# Patient Record
Sex: Female | Born: 1960
Health system: Southern US, Community
[De-identification: ages and names within clinical notes are randomized; demographics above are authoritative.]

## PROBLEM LIST (undated history)

## (undated) DIAGNOSIS — I10 Essential (primary) hypertension: Secondary | ICD-10-CM

## (undated) DIAGNOSIS — K219 Gastro-esophageal reflux disease without esophagitis: Secondary | ICD-10-CM

## (undated) DIAGNOSIS — E785 Hyperlipidemia, unspecified: Secondary | ICD-10-CM

## (undated) HISTORY — DX: Essential (primary) hypertension: I10

## (undated) HISTORY — DX: Hyperlipidemia, unspecified: E78.5

## (undated) HISTORY — DX: Gastro-esophageal reflux disease without esophagitis: K21.9

## (undated) HISTORY — PX: COLPOSCOPY: SHX161

---

## 2018-11-30 ENCOUNTER — Ambulatory Visit: Payer: 59 | Admitting: Family Medicine

## 2018-11-30 ENCOUNTER — Encounter: Payer: Self-pay | Admitting: Family Medicine

## 2018-11-30 VITALS — BP 108/72 | HR 62 | Temp 97.5°F | Ht <= 58 in | Wt 126.8 lb

## 2018-11-30 DIAGNOSIS — I10 Essential (primary) hypertension: Secondary | ICD-10-CM

## 2018-11-30 DIAGNOSIS — M79675 Pain in left toe(s): Secondary | ICD-10-CM

## 2018-11-30 DIAGNOSIS — E7849 Other hyperlipidemia: Secondary | ICD-10-CM | POA: Diagnosis not present

## 2018-11-30 DIAGNOSIS — K219 Gastro-esophageal reflux disease without esophagitis: Secondary | ICD-10-CM

## 2018-11-30 DIAGNOSIS — E785 Hyperlipidemia, unspecified: Secondary | ICD-10-CM

## 2018-11-30 DIAGNOSIS — G629 Polyneuropathy, unspecified: Secondary | ICD-10-CM

## 2018-11-30 DIAGNOSIS — G63 Polyneuropathy in diseases classified elsewhere: Secondary | ICD-10-CM

## 2018-11-30 MED ORDER — VALSARTAN 160 MG PO TABS: 160.0000 mg | ORAL_TABLET | Freq: Every day | ORAL | 3 refills | Status: DC

## 2018-11-30 NOTE — Progress Notes (Signed)
BP 108/72   Pulse 62   Temp (!) 97.5 F (36.4 C) (Oral)   Ht '4\' 9"'  (1.448 m)   Wt 126 lb 12.8 oz (57.5 kg)   BMI 27.44 kg/m    Subjective:    Patient ID: Amy Mccarty, female    DOB: 24-Feb-1961, 57 y.o.   MRN: [id number]  HPI: Kayelyn Lemon is a 57 y.o. female presenting on 11/30/2018 for New Patient (Initial Visit) (est care- Do you have records from [place]- [facility] ); Generalized Body Aches (Patient states that she has shooting pains all over her body. Has been on and off but was worse last night.); and Nail Problem (Left big toe- Patient states that it has been like that for awhile)   HPI Hypertension She is coming in for as a new patient to establish care with our office.  Patient is currently on valsartan-hydrochlorthiazide, and their blood pressure today is 108/72. Patient denies any lightheadedness or dizziness. Patient denies headaches, blurred vision, chest pains, shortness of breath, or weakness. Denies any side effects from medication and is content with current medication.   GERD Patient is currently on pantoprazole.  She denies any major symptoms or abdominal pain or belching or burping. She denies any blood in her stool or lightheadedness or dizziness.   Hyperlipidemia Patient is coming in for recheck of his hyperlipidemia. The patient is currently taking pravastatin. They deny any issues with myalgias or history of liver damage from it. They deny any focal numbness or weakness or chest pain.   Left toe pain and burning Patient comes in with complaints of left toe pain and burning in the left toe that has been bothering her over the past few months and has been gradually increasing.  She describes it as a burning sensation that is near the end of her toe but in the middle, it does not hurt more when she touches it but is more in the middle.  Patient also complains of body aches and shooting pain and muscle aches all of her body  Relevant past medical,  surgical, family and social history reviewed and updated as indicated. Interim medical history since our last visit reviewed. Allergies and medications reviewed and updated.  Review of Systems  Constitutional: Negative for chills and fever.  Eyes: Negative for visual disturbance.  Respiratory: Negative for chest tightness and shortness of breath.   Cardiovascular: Negative for chest pain and leg swelling.  Musculoskeletal: Positive for arthralgias and myalgias. Negative for back pain and gait problem.  Skin: Negative for rash.  Neurological: Negative for light-headedness and headaches.  Psychiatric/Behavioral: Negative for agitation and behavioral problems.  All other systems reviewed and are negative.   Per HPI unless specifically indicated above  Social History   Socioeconomic History  . Marital status: Married    Spouse name: Not on file  . Number of children: Not on file  . Years of education: Not on file  . Highest education level: Not on file  Occupational History  . Not on file  Social Needs  . Financial resource strain: Not on file  . Food insecurity:    Worry: Not on file    Inability: Not on file  . Transportation needs:    Medical: Not on file    Non-medical: Not on file  Tobacco Use  . Smoking status: Never Smoker  . Smokeless tobacco: Never Used  Substance and Sexual Activity  . Alcohol use: Yes    Comment: occ  .  Drug use: Never  . Sexual activity: Not Currently    Comment: pain with intercourse with husband, will come back for pap  Lifestyle  . Physical activity:    Days per week: Not on file    Minutes per session: Not on file  . Stress: Not on file  Relationships  . Social connections:    Talks on phone: Not on file    Gets together: Not on file    Attends religious service: Not on file    Active member of club or organization: Not on file    Attends meetings of clubs or organizations: Not on file    Relationship status: Not on file  .  Intimate partner violence:    Fear of current or ex partner: Not on file    Emotionally abused: Not on file    Physically abused: Not on file    Forced sexual activity: Not on file  Other Topics Concern  . Not on file  Social History Narrative  . Not on file    Past Surgical History:  Procedure Laterality Date  . CESAREAN SECTION      Family History  Problem Relation Age of Onset  . Diabetes Mother   . Hyperlipidemia Mother   . Hypertension Mother   . Anuerysm Mother   . Stroke Father   . Diabetes Brother   . Hyperlipidemia Brother   . Hypertension Brother     Allergies as of 11/30/2018      Reactions   Ibuprofen Rash   Naproxen Rash      Medication List        Accurate as of 11/30/18 10:30 AM. Always use your most recent med list.          ipratropium 0.06 % nasal spray Commonly known as:  ATROVENT 2 sprays by Each Nare route Three (3) times a day.   pantoprazole 40 MG tablet Commonly known as:  PROTONIX Take 40 mg by mouth daily.   pravastatin 10 MG tablet Commonly known as:  PRAVACHOL Take 10 mg by mouth daily.   valsartan 160 MG tablet Commonly known as:  DIOVAN Take 1 tablet (160 mg total) by mouth daily.          Objective:    BP 108/72   Pulse 62   Temp (!) 97.5 F (36.4 C) (Oral)   Ht '4\' 9"'  (1.448 m)   Wt 126 lb 12.8 oz (57.5 kg)   BMI 27.44 kg/m   Wt Readings from Last 3 Encounters:  11/30/18 126 lb 12.8 oz (57.5 kg)    Physical Exam  Constitutional: She is oriented to person, place, and time. She appears well-developed and well-nourished. No distress.  Eyes: Conjunctivae are normal.  Cardiovascular: Normal rate, regular rhythm, normal heart sounds and intact distal pulses.  No murmur heard. Pulmonary/Chest: Effort normal and breath sounds normal. No respiratory distress. She has no wheezes.  Musculoskeletal: Normal range of motion. She exhibits no edema or tenderness (No tenderness on exam in muscles or toe with no erythema).    Neurological: She is alert and oriented to person, place, and time. Coordination normal.  Skin: Skin is warm and dry. No rash noted. She is not diaphoretic.  Psychiatric: She has a normal mood and affect. Her behavior is normal.  Nursing note and vitals reviewed.   No results found for this or any previous visit.    Assessment & Plan:   Problem List Items Addressed This Visit  Digestive   GERD (gastroesophageal reflux disease)   Relevant Medications   pantoprazole (PROTONIX) 40 MG tablet   Other Relevant Orders   CBC with Differential/Platelet (Completed)     Other   Hyperlipidemia LDL goal <130   Relevant Medications   pravastatin (PRAVACHOL) 10 MG tablet   valsartan (DIOVAN) 160 MG tablet   Other Relevant Orders   Lipid panel (Completed)    Other Visit Diagnoses    Essential hypertension    -  Primary   Relevant Medications   pravastatin (PRAVACHOL) 10 MG tablet   valsartan (DIOVAN) 160 MG tablet   Other Relevant Orders   CMP14+EGFR (Completed)   Toe pain, left       Relevant Orders   Uric acid (Completed)   Neuropathy       Relevant Orders   Vitamin B12 (Completed)   TSH (Completed)      Exercise for myalgia like pain   Follow up plan: Return in about 6 months (around 06/01/2019), or if symptoms worsen or fail to improve, for htn and gerd.  Caryl Pina, MD [facility] 11/30/2018, 10:30 AM

## 2018-12-01 LAB — CBC WITH DIFFERENTIAL/PLATELET
Basophils Absolute: 0 10*3/uL (ref 0.0–0.2)
Basos: 1 %
EOS (ABSOLUTE): 0.2 10*3/uL (ref 0.0–0.4)
Eos: 3 %
Hematocrit: 41.9 % (ref 34.0–46.6)
Hemoglobin: 13.9 g/dL (ref 11.1–15.9)
IMMATURE GRANS (ABS): 0 10*3/uL (ref 0.0–0.1)
IMMATURE GRANULOCYTES: 0 %
Lymphocytes Absolute: 1.9 10*3/uL (ref 0.7–3.1)
Lymphs: 32 %
MCH: 28.5 pg (ref 26.6–33.0)
MCHC: 33.2 g/dL (ref 31.5–35.7)
MCV: 86 fL (ref 79–97)
Monocytes Absolute: 0.6 10*3/uL (ref 0.1–0.9)
Monocytes: 11 %
NEUTROS PCT: 53 %
Neutrophils Absolute: 3.3 10*3/uL (ref 1.4–7.0)
Platelets: 280 10*3/uL (ref 150–450)
RBC: 4.87 x10E6/uL (ref 3.77–5.28)
RDW: 13 % (ref 12.3–15.4)
WBC: 6.1 10*3/uL (ref 3.4–10.8)

## 2018-12-01 LAB — LIPID PANEL
Chol/HDL Ratio: 3.3 ratio (ref 0.0–4.4)
Cholesterol, Total: 268 mg/dL — ABNORMAL HIGH (ref 100–199)
HDL: 82 mg/dL (ref >39)
LDL Calculated: 166 mg/dL — ABNORMAL HIGH (ref 0–99)
Triglycerides: 100 mg/dL (ref 0–149)
VLDL Cholesterol Cal: 20 mg/dL (ref 5–40)

## 2018-12-01 LAB — CMP14+EGFR
ALT: 25 IU/L (ref 0–32)
AST: 29 IU/L (ref 0–40)
Albumin/Globulin Ratio: 1.8 (ref 1.2–2.2)
Albumin: 4.7 g/dL (ref 3.5–5.5)
Alkaline Phosphatase: 69 IU/L (ref 39–117)
BUN/Creatinine Ratio: 18 (ref 9–23)
BUN: 12 mg/dL (ref 6–24)
Bilirubin Total: 0.4 mg/dL (ref 0.0–1.2)
CO2: 27 mmol/L (ref 20–29)
Calcium: 9.9 mg/dL (ref 8.7–10.2)
Chloride: 97 mmol/L (ref 96–106)
Creatinine, Ser: 0.67 mg/dL (ref 0.57–1.00)
GFR calc Af Amer: 114 mL/min/{1.73_m2} (ref >59)
GFR calc non Af Amer: 99 mL/min/{1.73_m2} (ref >59)
Globulin, Total: 2.6 g/dL (ref 1.5–4.5)
Glucose: 75 mg/dL (ref 65–99)
POTASSIUM: 3.4 mmol/L — AB (ref 3.5–5.2)
Sodium: 140 mmol/L (ref 134–144)
TOTAL PROTEIN: 7.3 g/dL (ref 6.0–8.5)

## 2018-12-01 LAB — URIC ACID: Uric Acid: 4.4 mg/dL (ref 2.5–7.1)

## 2018-12-01 LAB — VITAMIN B12: Vitamin B-12: 698 pg/mL (ref 232–1245)

## 2018-12-01 LAB — TSH: TSH: 1.98 u[IU]/mL (ref 0.450–4.500)

## 2018-12-07 ENCOUNTER — Ambulatory Visit (INDEPENDENT_AMBULATORY_CARE_PROVIDER_SITE_OTHER): Payer: 59 | Admitting: Family Medicine

## 2018-12-07 ENCOUNTER — Encounter: Payer: Self-pay | Admitting: Family Medicine

## 2018-12-07 VITALS — BP 130/82 | HR 61 | Temp 97.6°F | Ht <= 58 in | Wt 133.0 lb

## 2018-12-07 DIAGNOSIS — L918 Other hypertrophic disorders of the skin: Secondary | ICD-10-CM | POA: Insufficient documentation

## 2018-12-07 DIAGNOSIS — Z0001 Encounter for general adult medical examination with abnormal findings: Secondary | ICD-10-CM | POA: Diagnosis not present

## 2018-12-07 DIAGNOSIS — K644 Residual hemorrhoidal skin tags: Secondary | ICD-10-CM

## 2018-12-07 DIAGNOSIS — Z2821 Immunization not carried out because of patient refusal: Secondary | ICD-10-CM

## 2018-12-07 DIAGNOSIS — N941 Unspecified dyspareunia: Secondary | ICD-10-CM

## 2018-12-07 DIAGNOSIS — Z0000 Encounter for general adult medical examination without abnormal findings: Secondary | ICD-10-CM

## 2018-12-07 DIAGNOSIS — Z124 Encounter for screening for malignant neoplasm of cervix: Secondary | ICD-10-CM

## 2018-12-07 NOTE — Progress Notes (Signed)
Amy Mccarty is a 57 y.o. female presents to office today for annual physical exam examination.    Concerns today include: 1. none  Marital status: married, has 2 children that live locally, Substance use: none Diet: fair.  Trying to improve, Exercise: no structured.  Plans for working out soon Last eye exam: every 1- 2 years Last dental exam:  >2 years ago Last colonoscopy:  5 years ago w/ Dr Pollyann Kennedyosen.  Per her report normal. Last mammogram: 2018. Last pap smear: unsure.  Immunizations needed: declines flu and Tdap  Past Medical History:  Diagnosis Date  . GERD (gastroesophageal reflux disease)   . Hyperlipidemia   . Hypertension    Social History   Socioeconomic History  . Marital status: Married    Spouse name: Not on file  . Number of children: Not on file  . Years of education: Not on file  . Highest education level: Not on file  Occupational History  . Not on file  Social Needs  . Financial resource strain: Not on file  . Food insecurity:    Worry: Not on file    Inability: Not on file  . Transportation needs:    Medical: Not on file    Non-medical: Not on file  Tobacco Use  . Smoking status: Never Smoker  . Smokeless tobacco: Never Used  Substance and Sexual Activity  . Alcohol use: Yes    Comment: occ  . Drug use: Never  . Sexual activity: Not Currently    Comment: pain with intercourse with husband, will come back for pap  Lifestyle  . Physical activity:    Days per week: Not on file    Minutes per session: Not on file  . Stress: Not on file  Relationships  . Social connections:    Talks on phone: Not on file    Gets together: Not on file    Attends religious service: Not on file    Active member of club or organization: Not on file    Attends meetings of clubs or organizations: Not on file    Relationship status: Not on file  . Intimate partner violence:    Fear of current or ex partner: Not on file    Emotionally abused: Not on file   Physically abused: Not on file    Forced sexual activity: Not on file  Other Topics Concern  . Not on file  Social History Narrative  . Not on file   Past Surgical History:  Procedure Laterality Date  . CESAREAN SECTION     Family History  Problem Relation Age of Onset  . Diabetes Mother   . Hyperlipidemia Mother   . Hypertension Mother   . Anuerysm Mother   . Stroke Father   . Diabetes Brother   . Hyperlipidemia Brother   . Hypertension Brother     Current Outpatient Medications:  .  ipratropium (ATROVENT) 0.06 % nasal spray, 2 sprays by Each Nare route Three (3) times a day., Disp: , Rfl:  .  pantoprazole (PROTONIX) 40 MG tablet, Take 40 mg by mouth daily., Disp: , Rfl:  .  pravastatin (PRAVACHOL) 10 MG tablet, Take 10 mg by mouth daily., Disp: , Rfl:  .  valsartan (DIOVAN) 160 MG tablet, Take 1 tablet (160 mg total) by mouth daily., Disp: 90 tablet, Rfl: 3  Allergies  Allergen Reactions  . Ibuprofen Rash  . Naproxen Rash     ROS: Review of Systems Constitutional: negative Eyes: positive  for contacts/glasses Ears, nose, mouth, throat, and face: negative Respiratory: negative Cardiovascular: negative Gastrointestinal: negative Genitourinary:positive for skin tag Integument/breast: negative Hematologic/lymphatic: negative Musculoskeletal:negative Neurological: negative Behavioral/Psych: negative Endocrine: negative Allergic/Immunologic: negative    Physical exam BP 130/82   Pulse 61   Temp 97.6 F (36.4 C) (Oral)   Ht 4\' 9"  (1.448 m)   Wt 133 lb (60.3 kg)   BMI 28.78 kg/m  General appearance: alert, cooperative, appears stated age and no distress Head: Normocephalic, without obvious abnormality, atraumatic Eyes: negative findings: lids and lashes normal, conjunctivae and sclerae normal, corneas clear, pupils equal, round, reactive to light and accomodation and visual fields full to confrontation Ears: normal TM's and external ear canals both ears Nose:  Nares normal. Septum midline. Mucosa normal. No drainage or sinus tenderness. Throat: lips, mucosa, and tongue normal; teeth and gums normal Neck: no adenopathy, supple, symmetrical, trachea midline and thyroid not enlarged, symmetric, no tenderness/mass/nodules Back: symmetric, no curvature. ROM normal. No CVA tenderness. Lungs: clear to auscultation bilaterally Breasts: normal appearance, no masses or tenderness, No nipple retraction or dimpling, No nipple discharge or bleeding, No axillary or supraclavicular adenopathy, Normal to palpation without dominant masses Heart: regular rate and rhythm, S1, S2 normal, no murmur, click, rub or gallop Abdomen: soft, non-tender; bowel sounds normal; no masses,  no organomegaly Pelvic: no adnexal masses or tenderness, no cervical motion tenderness, rectovaginal septum normal, uterus normal size, shape, and consistency, vagina normal without discharge and cervix retroverted.  Slight bleeding w/ pap.  Atrophic changes noted externally Extremities: extremities normal, atraumatic, no cyanosis or edema Pulses: 2+ and symmetric Skin: several pigmented skin tags noted along neck.  she had a flesh colored skin tag near the rectum posteriorly. no evidence of inflammation or secondary infection. Lymph nodes: Cervical, supraclavicular, and axillary nodes normal. Neurologic: Alert and oriented X 3, normal strength and tone. Normal symmetric reflexes. Normal coordination and gait Psych: mood stable, speech normal, affect appropriate. Depression screen [facility]  2/9 12/07/2018 11/30/2018  Decreased Interest 0 0  Down, Depressed, Hopeless 0 0  PHQ - 2 Score 0 0  Altered sleeping 0 -  Tired, decreased energy 0 -  Change in appetite 0 -  Feeling bad or failure about yourself  0 -  Trouble concentrating 0 -  Moving slowly or fidgety/restless 0 -  Suicidal thoughts 0 -  PHQ-9 Score 0 -  Difficult doing work/chores Not difficult at all -    Assessment/ Plan: Amy Bullock here for annual physical exam.   1. Well woman exam (no gynecological exam) Unsure of last pap results.  Awaiting info from previous PCP - Pap IG and HPV (high risk) DNA detection  2. Screening for malignant neoplasm of cervix - Pap IG and HPV (high risk) DNA detection  3. Dyspareunia in female Encouraged lubrication.  Could consider adding topical estrogen cream in future if persistent.  May consider referral to pelvic floor rehab as well.  4. Skin tag of perianal region No evidence of infection/ inflammation  5. Skin tags, multiple acquired Discussed removal would be considered cosmetic at this time.  6. Refused influenza vaccine  7. Refuses tetanus, diphtheria, and acellular pertussis (Tdap) vaccination  Handout provided on healthy lifestyle choices, including diet (rich in fruits, vegetables and lean meats and low in salt and simple carbohydrates) and exercise (at least 30 minutes of moderate physical activity daily).  Patient to follow up in 1 year for annual exam or sooner if needed.  Shineka Auble M. Nadine Counts, DO

## 2018-12-07 NOTE — Patient Instructions (Signed)
You had labs performed today.  You will be contacted with the results of the labs once they are available, usually in the next 3 business days for routine lab work.  If you had a pap smear or biopsy performed, expect to be contacted in about 7-10 days.  Health Maintenance, Female Adopting a healthy lifestyle and getting preventive care can go a long way to promote health and wellness. Talk with your health care provider about what schedule of regular examinations is right for you. This is a good chance for you to check in with your provider about disease prevention and staying healthy. In between checkups, there are plenty of things you can do on your own. Experts have done a lot of research about which lifestyle changes and preventive measures are most likely to keep you healthy. Ask your health care provider for more information. Weight and diet Eat a healthy diet  Be sure to include plenty of vegetables, fruits, low-fat dairy products, and lean protein.  Do not eat a lot of foods high in solid fats, added sugars, or salt.  Get regular exercise. This is one of the most important things you can do for your health. ? Most adults should exercise for at least 150 minutes each week. The exercise should increase your heart rate and make you sweat (moderate-intensity exercise). ? Most adults should also do strengthening exercises at least twice a week. This is in addition to the moderate-intensity exercise.  Maintain a healthy weight  Body mass index (BMI) is a measurement that can be used to identify possible weight problems. It estimates body fat based on height and weight. Your health care provider can help determine your BMI and help you achieve or maintain a healthy weight.  For females 78 years of age and older: ? A BMI below 18.5 is considered underweight. ? A BMI of 18.5 to 24.9 is normal. ? A BMI of 25 to 29.9 is considered overweight. ? A BMI of 30 and above is considered  obese.  Watch levels of cholesterol and blood lipids  You should start having your blood tested for lipids and cholesterol at 55 years of age, then have this test every 5 years.  You may need to have your cholesterol levels checked more often if: ? Your lipid or cholesterol levels are high. ? You are older than 55 years of age. ? You are at high risk for heart disease.  Cancer screening Lung Cancer  Lung cancer screening is recommended for adults 8-36 years old who are at high risk for lung cancer because of a history of smoking.  A yearly low-dose CT scan of the lungs is recommended for people who: ? Currently smoke. ? Have quit within the past 15 years. ? Have at least a 30-pack-year history of smoking. A pack year is smoking an average of one pack of cigarettes a day for 1 year.  Yearly screening should continue until it has been 15 years since you quit.  Yearly screening should stop if you develop a health problem that would prevent you from having lung cancer treatment.  Breast Cancer  Practice breast self-awareness. This means understanding how your breasts normally appear and feel.  It also means doing regular breast self-exams. Let your health care provider know about any changes, no matter how small.  If you are in your 20s or 30s, you should have a clinical breast exam (CBE) by a health care provider every 1-3 years as part of a  regular health exam.  If you are 40 or older, have a CBE every year. Also consider having a breast X-ray (mammogram) every year.  If you have a family history of breast cancer, talk to your health care provider about genetic screening.  If you are at high risk for breast cancer, talk to your health care provider about having an MRI and a mammogram every year.  Breast cancer gene (BRCA) assessment is recommended for women who have family members with BRCA-related cancers. BRCA-related cancers  include: ? Breast. ? Ovarian. ? Tubal. ? Peritoneal cancers.  Results of the assessment will determine the need for genetic counseling and BRCA1 and BRCA2 testing.  Cervical Cancer Your health care provider may recommend that you be screened regularly for cancer of the pelvic organs (ovaries, uterus, and vagina). This screening involves a pelvic examination, including checking for microscopic changes to the surface of your cervix (Pap test). You may be encouraged to have this screening done every 3 years, beginning at age 58.  For women ages 28-65, health care providers may recommend pelvic exams and Pap testing every 3 years, or they may recommend the Pap and pelvic exam, combined with testing for human papilloma virus (HPV), every 5 years. Some types of HPV increase your risk of cervical cancer. Testing for HPV may also be done on women of any age with unclear Pap test results.  Other health care providers may not recommend any screening for nonpregnant women who are considered low risk for pelvic cancer and who do not have symptoms. Ask your health care provider if a screening pelvic exam is right for you.  If you have had past treatment for cervical cancer or a condition that could lead to cancer, you need Pap tests and screening for cancer for at least 20 years after your treatment. If Pap tests have been discontinued, your risk factors (such as having a new sexual partner) need to be reassessed to determine if screening should resume. Some women have medical problems that increase the chance of getting cervical cancer. In these cases, your health care provider may recommend more frequent screening and Pap tests.  Colorectal Cancer  This type of cancer can be detected and often prevented.  Routine colorectal cancer screening usually begins at 55 years of age and continues through 55 years of age.  Your health care provider may recommend screening at an earlier age if you have risk factors  for colon cancer.  Your health care provider may also recommend using home test kits to check for hidden blood in the stool.  A small camera at the end of a tube can be used to examine your colon directly (sigmoidoscopy or colonoscopy). This is done to check for the earliest forms of colorectal cancer.  Routine screening usually begins at age 40.  Direct examination of the colon should be repeated every 5-10 years through 55 years of age. However, you may need to be screened more often if early forms of precancerous polyps or small growths are found.  Skin Cancer  Check your skin from head to toe regularly.  Tell your health care provider about any new moles or changes in moles, especially if there is a change in a mole's shape or color.  Also tell your health care provider if you have a mole that is larger than the size of a pencil eraser.  Always use sunscreen. Apply sunscreen liberally and repeatedly throughout the day.  Protect yourself by wearing long sleeves, pants,  a wide-brimmed hat, and sunglasses whenever you are outside.  Heart disease, diabetes, and high blood pressure  High blood pressure causes heart disease and increases the risk of stroke. High blood pressure is more likely to develop in: ? People who have blood pressure in the high end of the normal range (130-139/85-89 mm Hg). ? People who are overweight or obese. ? People who are African American.  If you are 82-70 years of age, have your blood pressure checked every 3-5 years. If you are 56 years of age or older, have your blood pressure checked every year. You should have your blood pressure measured twice-once when you are at a hospital or clinic, and once when you are not at a hospital or clinic. Record the average of the two measurements. To check your blood pressure when you are not at a hospital or clinic, you can use: ? An automated blood pressure machine at a pharmacy. ? A home blood pressure monitor.  If  you are between 62 years and 4 years old, ask your health care provider if you should take aspirin to prevent strokes.  Have regular diabetes screenings. This involves taking a blood sample to check your fasting blood sugar level. ? If you are at a normal weight and have a low risk for diabetes, have this test once every three years after 55 years of age. ? If you are overweight and have a high risk for diabetes, consider being tested at a younger age or more often. Preventing infection Hepatitis B  If you have a higher risk for hepatitis B, you should be screened for this virus. You are considered at high risk for hepatitis B if: ? You were born in a country where hepatitis B is common. Ask your health care provider which countries are considered high risk. ? Your parents were born in a high-risk country, and you have not been immunized against hepatitis B (hepatitis B vaccine). ? You have HIV or AIDS. ? You use needles to inject street drugs. ? You live with someone who has hepatitis B. ? You have had sex with someone who has hepatitis B. ? You get hemodialysis treatment. ? You take certain medicines for conditions, including cancer, organ transplantation, and autoimmune conditions.  Hepatitis C  Blood testing is recommended for: ? Everyone born from [birth year] through 1963. ? Anyone with known risk factors for hepatitis C.  Sexually transmitted infections (STIs)  You should be screened for sexually transmitted infections (STIs) including gonorrhea and chlamydia if: ? You are sexually active and are younger than 55 years of age. ? You are older than 55 years of age and your health care provider tells you that you are at risk for this type of infection. ? Your sexual activity has changed since you were last screened and you are at an increased risk for chlamydia or gonorrhea. Ask your health care provider if you are at risk.  If you do not have HIV, but are at risk, it may be recommended  that you take a prescription medicine daily to prevent HIV infection. This is called pre-exposure prophylaxis (PrEP). You are considered at risk if: ? You are sexually active and do not regularly use condoms or know the HIV status of your partner(s). ? You take drugs by injection. ? You are sexually active with a partner who has HIV.  Talk with your health care provider about whether you are at high risk of being infected with HIV. If you choose  to begin PrEP, you should first be tested for HIV. You should then be tested every 3 months for as long as you are taking PrEP. Pregnancy  If you are premenopausal and you may become pregnant, ask your health care provider about preconception counseling.  If you may become pregnant, take 400 to 800 micrograms (mcg) of folic acid every day.  If you want to prevent pregnancy, talk to your health care provider about birth control (contraception). Osteoporosis and menopause  Osteoporosis is a disease in which the bones lose minerals and strength with aging. This can result in serious bone fractures. Your risk for osteoporosis can be identified using a bone density scan.  If you are 26 years of age or older, or if you are at risk for osteoporosis and fractures, ask your health care provider if you should be screened.  Ask your health care provider whether you should take a calcium or vitamin D supplement to lower your risk for osteoporosis.  Menopause may have certain physical symptoms and risks.  Hormone replacement therapy may reduce some of these symptoms and risks. Talk to your health care provider about whether hormone replacement therapy is right for you. Follow these instructions at home:  Schedule regular health, dental, and eye exams.  Stay current with your immunizations.  Do not use any tobacco products including cigarettes, chewing tobacco, or electronic cigarettes.  If you are pregnant, do not drink alcohol.  If you are  breastfeeding, limit how much and how often you drink alcohol.  Limit alcohol intake to no more than 1 drink per day for nonpregnant women. One drink equals 12 ounces of beer, 5 ounces of wine, or 1 ounces of hard liquor.  Do not use street drugs.  Do not share needles.  Ask your health care provider for help if you need support or information about quitting drugs.  Tell your health care provider if you often feel depressed.  Tell your health care provider if you have ever been abused or do not feel safe at home. This information is not intended to replace advice given to you by your health care provider. Make sure you discuss any questions you have with your health care provider. Document Released: 07/01/2011 Document Revised: 05/23/2016 Document Reviewed: 09/19/2015 Elsevier Interactive Patient Education  [employer].

## 2018-12-09 ENCOUNTER — Telehealth: Payer: Self-pay | Admitting: Family Medicine

## 2018-12-09 LAB — PAP IG AND HPV HIGH-RISK: HPV, high-risk: POSITIVE — AB

## 2018-12-10 NOTE — Telephone Encounter (Signed)
Pt aware of results and voiced understanding. 

## 2019-01-29 ENCOUNTER — Encounter: Payer: Self-pay | Admitting: Family Medicine

## 2019-01-29 ENCOUNTER — Ambulatory Visit: Payer: BLUE CROSS/BLUE SHIELD | Admitting: Family Medicine

## 2019-01-29 VITALS — BP 137/78 | HR 62 | Temp 97.5°F | Ht <= 58 in | Wt 128.6 lb

## 2019-01-29 DIAGNOSIS — Z1211 Encounter for screening for malignant neoplasm of colon: Secondary | ICD-10-CM | POA: Diagnosis not present

## 2019-01-29 DIAGNOSIS — K219 Gastro-esophageal reflux disease without esophagitis: Secondary | ICD-10-CM

## 2019-01-29 NOTE — Progress Notes (Signed)
BP 137/78   Pulse 62   Temp (!) 97.5 F (36.4 C) (Oral)   Ht 4\' 9"  (1.448 m)   Wt 128 lb 9.6 oz (58.3 kg)   BMI 27.83 kg/m    Subjective:    Patient ID: Amy Mccarty, female    DOB: 26-Aug-1961, 58 y.o.   MRN: [id number]  HPI: Amy Mccarty is a 58 y.o. female presenting on 01/29/2019 for lower abd pain (x 3 days Patient states this has happened before but it was not constaint but now it is.) and Results (Patient wants to talk about her pap results.)   HPI Upper abdominal pain and belching and burping Patient is coming in with complaints of upper abdominal pain and indigestion that has been worse over the past 3 days but she has been having it off and on.  She does admit that she used to be on Protonix to help with this but she is not on it currently just because she has forgotten to take it. She denies any nausea or vomiting.  Relevant past medical, surgical, family and social history reviewed and updated as indicated. Interim medical history since our last visit reviewed. Allergies and medications reviewed and updated.  Review of Systems  Constitutional: Negative for chills and fever.  Eyes: Negative for redness and visual disturbance.  Respiratory: Negative for chest tightness and shortness of breath.   Cardiovascular: Negative for chest pain and leg swelling.  Gastrointestinal: Positive for abdominal pain. Negative for constipation, diarrhea and vomiting.  Musculoskeletal: Negative for back pain and gait problem.  Skin: Negative for rash.  Neurological: Negative for light-headedness and headaches.  Psychiatric/Behavioral: Negative for agitation and behavioral problems.  All other systems reviewed and are negative.   Per HPI unless specifically indicated above   Allergies as of 01/29/2019      Reactions   Ibuprofen Rash   Naproxen Rash      Medication List       Accurate as of January 29, 2019 10:56 AM. Always use your most recent med list.          ipratropium 0.06 % nasal spray Commonly known as:  ATROVENT 2 sprays by Each Nare route Three (3) times a day.   pantoprazole 40 MG tablet Commonly known as:  PROTONIX Take 40 mg by mouth daily.   pravastatin 10 MG tablet Commonly known as:  PRAVACHOL Take 10 mg by mouth daily.   valsartan 160 MG tablet Commonly known as:  DIOVAN Take 1 tablet (160 mg total) by mouth daily.          Objective:    BP 137/78   Pulse 62   Temp (!) 97.5 F (36.4 C) (Oral)   Ht 4\' 9"  (1.448 m)   Wt 128 lb 9.6 oz (58.3 kg)   BMI 27.83 kg/m   Wt Readings from Last 3 Encounters:  01/29/19 128 lb 9.6 oz (58.3 kg)  12/07/18 133 lb (60.3 kg)  11/30/18 126 lb 12.8 oz (57.5 kg)    Physical Exam Vitals signs and nursing note reviewed.  Constitutional:      General: She is not in acute distress.    Appearance: She is well-developed. She is not diaphoretic.  Eyes:     Conjunctiva/sclera: Conjunctivae normal.  Cardiovascular:     Rate and Rhythm: Normal rate and regular rhythm.     Heart sounds: Normal heart sounds. No murmur.  Pulmonary:     Effort: Pulmonary effort is normal. No respiratory distress.  Breath sounds: Normal breath sounds. No wheezing.  Abdominal:     General: Abdomen is flat. There is no distension.     Tenderness: There is abdominal tenderness in the epigastric area. There is no right CVA tenderness, left CVA tenderness, guarding or rebound.  Musculoskeletal: Normal range of motion.        General: No tenderness.  Skin:    General: Skin is warm and dry.     Findings: No rash.  Neurological:     Mental Status: She is alert and oriented to person, place, and time.     Coordination: Coordination normal.  Psychiatric:        Behavior: Behavior normal.        Assessment & Plan:   Problem List Items Addressed This Visit      Digestive   GERD (gastroesophageal reflux disease) - Primary   Relevant Orders   Ambulatory referral to Gastroenterology    Other Visit  Diagnoses    Colon cancer screening       Relevant Orders   Ambulatory referral to Gastroenterology      Not taking Protonix currently recommended for her to get back on it  Patient wanted to discuss being HPV positive but normal cervical epithelium, we discussed this and reassured her that we will just do repeat testing in 1 year and there is nothing further to be alarmed about at this point.  She had questions about whether is an STD or she just got it and we are reassured her that this could have been something that she is had most of her life and she became sexually active Follow up plan: Return if symptoms worsen or fail to improve.  Counseling provided for all of the vaccine components Orders Placed This Encounter  Procedures  . Ambulatory referral to Gastroenterology    Arville CareJoshua Dearius Hoffmann, MD [facility] 01/29/2019, 10:56 AM

## 2019-06-01 ENCOUNTER — Encounter (INDEPENDENT_AMBULATORY_CARE_PROVIDER_SITE_OTHER): Payer: Self-pay

## 2019-06-02 ENCOUNTER — Ambulatory Visit: Payer: Self-pay | Admitting: Family Medicine

## 2019-06-17 ENCOUNTER — Other Ambulatory Visit: Payer: Self-pay

## 2019-06-18 ENCOUNTER — Ambulatory Visit: Payer: BLUE CROSS/BLUE SHIELD | Admitting: Family Medicine

## 2019-06-18 ENCOUNTER — Encounter: Payer: Self-pay | Admitting: Family Medicine

## 2019-06-18 VITALS — BP 150/94 | HR 59 | Temp 98.1°F | Ht <= 58 in | Wt 130.8 lb

## 2019-06-18 DIAGNOSIS — Z6829 Body mass index (BMI) 29.0-29.9, adult: Secondary | ICD-10-CM | POA: Insufficient documentation

## 2019-06-18 DIAGNOSIS — E663 Overweight: Secondary | ICD-10-CM | POA: Diagnosis not present

## 2019-06-18 DIAGNOSIS — K219 Gastro-esophageal reflux disease without esophagitis: Secondary | ICD-10-CM

## 2019-06-18 DIAGNOSIS — E785 Hyperlipidemia, unspecified: Secondary | ICD-10-CM

## 2019-06-18 NOTE — Progress Notes (Signed)
BP (!) 150/94   Pulse (!) 59   Temp 98.1 F (36.7 C) (Oral)   Ht '4\' 9"'  (1.448 m)   Wt 130 lb 12.8 oz (59.3 kg)   BMI 28.30 kg/m    Subjective:   Patient ID: Amy Mccarty, female    DOB: 09/04/1961, 58 y.o.   MRN: [id number]  HPI: Amy Mccarty is a 58 y.o. female presenting on 06/18/2019 for Hypertension (6 month follow up- been taking pravastatin every other day since it is causing a headache) and Gastroesophageal Reflux   HPI Hypertension Patient is currently on Diovan, and their blood pressure today is 150/90. Patient denies any lightheadedness or dizziness. Patient denies headaches, blurred vision, chest pains, shortness of breath, or weakness. Denies any side effects from medication and is content with current medication.   Hyperlipidemia Patient is coming in for recheck of his hyperlipidemia. The patient is currently taking pravastatin. They deny any issues with myalgias or history of liver damage from it. They deny any focal numbness or weakness or chest pain.   GERD Patient is currently on Protonix.  She denies any major symptoms or abdominal pain or belching or burping. She denies any blood in her stool or lightheadedness or dizziness.   Relevant past medical, surgical, family and social history reviewed and updated as indicated. Interim medical history since our last visit reviewed. Allergies and medications reviewed and updated.  Review of Systems  Constitutional: Negative for chills and fever.  Eyes: Negative for redness and visual disturbance.  Respiratory: Negative for chest tightness and shortness of breath.   Cardiovascular: Negative for chest pain and leg swelling.  Musculoskeletal: Negative for back pain and gait problem.  Skin: Negative for rash.  Neurological: Negative for light-headedness and headaches.  Psychiatric/Behavioral: Negative for agitation and behavioral problems.  All other systems reviewed and are negative.   Per HPI unless  specifically indicated above   Allergies as of 06/18/2019      Reactions   Ibuprofen Rash   Naproxen Rash      Medication List       Accurate as of June 18, 2019  1:56 PM. If you have any questions, ask your nurse or doctor.        ipratropium 0.06 % nasal spray Commonly known as: ATROVENT 2 sprays by Each Nare route Three (3) times a day.   pantoprazole 40 MG tablet Commonly known as: PROTONIX Take 40 mg by mouth daily.   pravastatin 10 MG tablet Commonly known as: PRAVACHOL Take 10 mg by mouth every other day.   valsartan 160 MG tablet Commonly known as: Diovan Take 1 tablet (160 mg total) by mouth daily.        Objective:   BP (!) 150/94   Pulse (!) 59   Temp 98.1 F (36.7 C) (Oral)   Ht '4\' 9"'  (1.448 m)   Wt 130 lb 12.8 oz (59.3 kg)   BMI 28.30 kg/m   Wt Readings from Last 3 Encounters:  06/18/19 130 lb 12.8 oz (59.3 kg)  01/29/19 128 lb 9.6 oz (58.3 kg)  12/07/18 133 lb (60.3 kg)    Physical Exam Vitals signs and nursing note reviewed.  Constitutional:      General: She is not in acute distress.    Appearance: She is well-developed. She is not diaphoretic.  Eyes:     Conjunctiva/sclera: Conjunctivae normal.  Cardiovascular:     Rate and Rhythm: Normal rate and regular rhythm.     Heart sounds:  Normal heart sounds. No murmur.  Pulmonary:     Effort: Pulmonary effort is normal. No respiratory distress.     Breath sounds: Normal breath sounds. No wheezing.  Musculoskeletal: Normal range of motion.        General: No tenderness.  Skin:    General: Skin is warm and dry.     Findings: No rash.  Neurological:     Mental Status: She is alert and oriented to person, place, and time.     Coordination: Coordination normal.  Psychiatric:        Behavior: Behavior normal.     Assessment & Plan:   Problem List Items Addressed This Visit      Digestive   GERD (gastroesophageal reflux disease)   Relevant Orders   CBC with Differential/Platelet    CMP14+EGFR     Other   Hyperlipidemia LDL goal <130 - Primary   Relevant Orders   Lipid panel   Overweight (BMI 25.0-29.9)      Continue current medication, patient will monitor blood pressure at home and will call in a week with some numbers and if it still elevated we may have to increase her medication.  Follow up plan: Return in about 6 months (around 12/18/2019), or if symptoms worsen or fail to improve, for Pap smear and well woman exam.  Counseling provided for all of the vaccine components No orders of the defined types were placed in this encounter.   Amy Pina, MD [facility] 06/18/2019, 1:56 PM

## 2019-06-19 LAB — CMP14+EGFR
ALT: 21 IU/L (ref 0–32)
AST: 18 IU/L (ref 0–40)
Albumin/Globulin Ratio: 1.6 (ref 1.2–2.2)
Albumin: 4.3 g/dL (ref 3.8–4.9)
Alkaline Phosphatase: 92 IU/L (ref 39–117)
BUN/Creatinine Ratio: 13 (ref 9–23)
BUN: 9 mg/dL (ref 6–24)
Bilirubin Total: 0.3 mg/dL (ref 0.0–1.2)
CO2: 26 mmol/L (ref 20–29)
Calcium: 9.8 mg/dL (ref 8.7–10.2)
Chloride: 103 mmol/L (ref 96–106)
Creatinine, Ser: 0.69 mg/dL (ref 0.57–1.00)
GFR calc Af Amer: 113 mL/min/{1.73_m2} (ref >59)
GFR calc non Af Amer: 98 mL/min/{1.73_m2} (ref >59)
Globulin, Total: 2.7 g/dL (ref 1.5–4.5)
Glucose: 85 mg/dL (ref 65–99)
Potassium: 4.2 mmol/L (ref 3.5–5.2)
Sodium: 141 mmol/L (ref 134–144)
Total Protein: 7 g/dL (ref 6.0–8.5)

## 2019-06-19 LAB — CBC WITH DIFFERENTIAL/PLATELET
Basophils Absolute: 0 10*3/uL (ref 0.0–0.2)
Basos: 0 %
EOS (ABSOLUTE): 0.2 10*3/uL (ref 0.0–0.4)
Eos: 2 %
Hematocrit: 42.3 % (ref 34.0–46.6)
Hemoglobin: 13.6 g/dL (ref 11.1–15.9)
Immature Grans (Abs): 0 10*3/uL (ref 0.0–0.1)
Immature Granulocytes: 0 %
Lymphocytes Absolute: 1.8 10*3/uL (ref 0.7–3.1)
Lymphs: 27 %
MCH: 28.2 pg (ref 26.6–33.0)
MCHC: 32.2 g/dL (ref 31.5–35.7)
MCV: 88 fL (ref 79–97)
Monocytes Absolute: 0.6 10*3/uL (ref 0.1–0.9)
Monocytes: 8 %
Neutrophils Absolute: 4.2 10*3/uL (ref 1.4–7.0)
Neutrophils: 63 %
Platelets: 254 10*3/uL (ref 150–450)
RBC: 4.82 x10E6/uL (ref 3.77–5.28)
RDW: 14.1 % (ref 11.7–15.4)
WBC: 6.8 10*3/uL (ref 3.4–10.8)

## 2019-06-19 LAB — LIPID PANEL
Chol/HDL Ratio: 2.7 ratio (ref 0.0–4.4)
Cholesterol, Total: 221 mg/dL — ABNORMAL HIGH (ref 100–199)
HDL: 83 mg/dL (ref >39)
LDL Calculated: 120 mg/dL — ABNORMAL HIGH (ref 0–99)
Triglycerides: 92 mg/dL (ref 0–149)
VLDL Cholesterol Cal: 18 mg/dL (ref 5–40)

## 2019-10-11 ENCOUNTER — Other Ambulatory Visit: Payer: Self-pay | Admitting: Family Medicine

## 2019-10-11 MED ORDER — PRAVASTATIN SODIUM 10 MG PO TABS: 10.0000 mg | ORAL_TABLET | ORAL | 0 refills | Status: DC

## 2019-10-11 MED ORDER — PANTOPRAZOLE SODIUM 40 MG PO TBEC: 40.0000 mg | DELAYED_RELEASE_TABLET | Freq: Every day | ORAL | 0 refills | Status: DC

## 2019-10-11 NOTE — Telephone Encounter (Signed)
Meds sent to pharmacy. Patient notified 

## 2019-12-09 ENCOUNTER — Encounter: Payer: BLUE CROSS/BLUE SHIELD | Admitting: Family Medicine

## 2019-12-14 ENCOUNTER — Other Ambulatory Visit: Payer: Self-pay

## 2019-12-15 ENCOUNTER — Other Ambulatory Visit: Payer: Self-pay | Admitting: Family Medicine

## 2019-12-15 ENCOUNTER — Encounter: Payer: BLUE CROSS/BLUE SHIELD | Admitting: Family Medicine

## 2019-12-15 DIAGNOSIS — I10 Essential (primary) hypertension: Secondary | ICD-10-CM

## 2019-12-21 ENCOUNTER — Other Ambulatory Visit: Payer: Self-pay

## 2019-12-21 ENCOUNTER — Ambulatory Visit (INDEPENDENT_AMBULATORY_CARE_PROVIDER_SITE_OTHER): Payer: BLUE CROSS/BLUE SHIELD | Admitting: Family Medicine

## 2019-12-21 ENCOUNTER — Encounter: Payer: Self-pay | Admitting: Family Medicine

## 2019-12-21 VITALS — BP 131/78 | HR 64 | Temp 98.1°F | Resp 20 | Ht <= 58 in | Wt 134.0 lb

## 2019-12-21 DIAGNOSIS — Z6829 Body mass index (BMI) 29.0-29.9, adult: Secondary | ICD-10-CM | POA: Diagnosis not present

## 2019-12-21 DIAGNOSIS — K219 Gastro-esophageal reflux disease without esophagitis: Secondary | ICD-10-CM

## 2019-12-21 DIAGNOSIS — Z124 Encounter for screening for malignant neoplasm of cervix: Secondary | ICD-10-CM

## 2019-12-21 DIAGNOSIS — Z01419 Encounter for gynecological examination (general) (routine) without abnormal findings: Secondary | ICD-10-CM | POA: Diagnosis not present

## 2019-12-21 DIAGNOSIS — Z0000 Encounter for general adult medical examination without abnormal findings: Secondary | ICD-10-CM | POA: Diagnosis not present

## 2019-12-21 DIAGNOSIS — Z1231 Encounter for screening mammogram for malignant neoplasm of breast: Secondary | ICD-10-CM

## 2019-12-21 DIAGNOSIS — I10 Essential (primary) hypertension: Secondary | ICD-10-CM

## 2019-12-21 DIAGNOSIS — Z0001 Encounter for general adult medical examination with abnormal findings: Secondary | ICD-10-CM

## 2019-12-21 DIAGNOSIS — E785 Hyperlipidemia, unspecified: Secondary | ICD-10-CM

## 2019-12-21 DIAGNOSIS — Z13220 Encounter for screening for lipoid disorders: Secondary | ICD-10-CM | POA: Diagnosis not present

## 2019-12-21 DIAGNOSIS — Z13820 Encounter for screening for osteoporosis: Secondary | ICD-10-CM | POA: Diagnosis not present

## 2019-12-21 DIAGNOSIS — E1159 Type 2 diabetes mellitus with other circulatory complications: Secondary | ICD-10-CM | POA: Insufficient documentation

## 2019-12-21 DIAGNOSIS — E559 Vitamin D deficiency, unspecified: Secondary | ICD-10-CM

## 2019-12-21 DIAGNOSIS — Z01411 Encounter for gynecological examination (general) (routine) with abnormal findings: Secondary | ICD-10-CM

## 2019-12-21 MED ORDER — PRAVASTATIN SODIUM 10 MG PO TABS: 10.0000 mg | ORAL_TABLET | ORAL | 3 refills | Status: DC

## 2019-12-21 MED ORDER — PANTOPRAZOLE SODIUM 40 MG PO TBEC: 40.0000 mg | DELAYED_RELEASE_TABLET | Freq: Every day | ORAL | 3 refills | Status: DC

## 2019-12-21 MED ORDER — VALSARTAN 160 MG PO TABS: 160.0000 mg | ORAL_TABLET | Freq: Every day | ORAL | 6 refills | Status: DC

## 2019-12-21 NOTE — Patient Instructions (Signed)
Health Maintenance, Female Adopting a healthy lifestyle and getting preventive care are important in promoting health and wellness. Ask your health care provider about:  The right schedule for you to have regular tests and exams.  Things you can do on your own to prevent diseases and keep yourself healthy. What should I know about diet, weight, and exercise? Eat a healthy diet   Eat a diet that includes plenty of vegetables, fruits, low-fat dairy products, and lean protein.  Do not eat a lot of foods that are high in solid fats, added sugars, or sodium. Maintain a healthy weight Body mass index (BMI) is used to identify weight problems. It estimates body fat based on height and weight. Your health care provider can help determine your BMI and help you achieve or maintain a healthy weight. Get regular exercise Get regular exercise. This is one of the most important things you can do for your health. Most adults should:  Exercise for at least 150 minutes each week. The exercise should increase your heart rate and make you sweat (moderate-intensity exercise).  Do strengthening exercises at least twice a week. This is in addition to the moderate-intensity exercise.  Spend less time sitting. Even light physical activity can be beneficial. Watch cholesterol and blood lipids Have your blood tested for lipids and cholesterol at 56 years of age, then have this test every 5 years. Have your cholesterol levels checked more often if:  Your lipid or cholesterol levels are high.  You are older than 56 years of age.  You are at high risk for heart disease. What should I know about cancer screening? Depending on your health history and family history, you may need to have cancer screening at various ages. This may include screening for:  Breast cancer.  Cervical cancer.  Colorectal cancer.  Skin cancer.  Lung cancer. What should I know about heart disease, diabetes, and high blood  pressure? Blood pressure and heart disease  High blood pressure causes heart disease and increases the risk of stroke. This is more likely to develop in people who have high blood pressure readings, are of African descent, or are overweight.  Have your blood pressure checked: ? Every 3-5 years if you are 18-39 years of age. ? Every year if you are 40 years old or older. Diabetes Have regular diabetes screenings. This checks your fasting blood sugar level. Have the screening done:  Once every three years after age 40 if you are at a normal weight and have a low risk for diabetes.  More often and at a younger age if you are overweight or have a high risk for diabetes. What should I know about preventing infection? Hepatitis B If you have a higher risk for hepatitis B, you should be screened for this virus. Talk with your health care provider to find out if you are at risk for hepatitis B infection. Hepatitis C Testing is recommended for:  Everyone born from 1945 through 1965.  Anyone with known risk factors for hepatitis C. Sexually transmitted infections (STIs)  Get screened for STIs, including gonorrhea and chlamydia, if: ? You are sexually active and are younger than 56 years of age. ? You are older than 56 years of age and your health care provider tells you that you are at risk for this type of infection. ? Your sexual activity has changed since you were last screened, and you are at increased risk for chlamydia or gonorrhea. Ask your health care provider if   you are at risk.  Ask your health care provider about whether you are at high risk for HIV. Your health care provider may recommend a prescription medicine to help prevent HIV infection. If you choose to take medicine to prevent HIV, you should first get tested for HIV. You should then be tested every 3 months for as long as you are taking the medicine. Pregnancy  If you are about to stop having your period (premenopausal) and  you may become pregnant, seek counseling before you get pregnant.  Take 400 to 800 micrograms (mcg) of folic acid every day if you become pregnant.  Ask for birth control (contraception) if you want to prevent pregnancy. Osteoporosis and menopause Osteoporosis is a disease in which the bones lose minerals and strength with aging. This can result in bone fractures. If you are 65 years old or older, or if you are at risk for osteoporosis and fractures, ask your health care provider if you should:  Be screened for bone loss.  Take a calcium or vitamin D supplement to lower your risk of fractures.  Be given hormone replacement therapy (HRT) to treat symptoms of menopause. Follow these instructions at home: Lifestyle  Do not use any products that contain nicotine or tobacco, such as cigarettes, e-cigarettes, and chewing tobacco. If you need help quitting, ask your health care provider.  Do not use street drugs.  Do not share needles.  Ask your health care provider for help if you need support or information about quitting drugs. Alcohol use  Do not drink alcohol if: ? Your health care provider tells you not to drink. ? You are pregnant, may be pregnant, or are planning to become pregnant.  If you drink alcohol: ? Limit how much you use to 0-1 drink a day. ? Limit intake if you are breastfeeding.  Be aware of how much alcohol is in your drink. In the [place] one drink equals one 12 oz bottle of beer (355 mL), one 5 oz glass of wine (148 mL), or one 1 oz glass of hard liquor (44 mL). General instructions  Schedule regular health, dental, and eye exams.  Stay current with your vaccines.  Tell your health care provider if: ? You often feel depressed. ? You have ever been abused or do not feel safe at home. Summary  Adopting a healthy lifestyle and getting preventive care are important in promoting health and wellness.  Follow your health care provider's instructions about healthy  diet, exercising, and getting tested or screened for diseases.  Follow your health care provider's instructions on monitoring your cholesterol and blood pressure. This information is not intended to replace advice given to you by your health care provider. Make sure you discuss any questions you have with your health care provider. Document Released: 07/01/2011 Document Revised: 12/09/2018 Document Reviewed: 12/09/2018 Elsevier Patient Education  2020 [employer]vier Inc.  

## 2019-12-21 NOTE — Progress Notes (Signed)
Subjective:  Patient ID: Amy Mccarty, female    DOB: July 30, 1961, 58 y.o.   MRN: [id number]  Patient Care Team: Dettinger, Fransisca Kaufmann, MD as PCP - General (Family Medicine)   Chief Complaint:  Gynecologic Exam   HPI: Amy Mccarty is a 58 y.o. female presenting on 12/21/2019 for Gynecologic Exam   Pt presents today for her annual physical exam with PAP. Pt states she has been doing well overall. States she does have a skin tag to her rectal area that she would like removed. States it does not bother her but does get irritated at times due to location.  She reports a colonoscopy within the last 10 years, will request records.  Last mammogram over a year ago. Last PAP HPV positive last year with NILM.  She does have GERD that is very well controlled with daily pantoprazole. No red flags concerning for malignancy or esophagitis.  Hyperlipidemia is treated with statin therapy daily. No reported side effects.  Hypertension is treated with Diovan. She does try to comply with diet. No regular exercise.  She is postmenopausal, LMP 2011. Not on calcium repletion therapy, will order DEXA.     Relevant past medical, surgical, family, and social history reviewed and updated as indicated.  Allergies and medications reviewed and updated. Date reviewed: Chart in Epic.   Past Medical History:  Diagnosis Date  . GERD (gastroesophageal reflux disease)   . Hyperlipidemia   . Hypertension     Past Surgical History:  Procedure Laterality Date  . CESAREAN SECTION      Social History   Socioeconomic History  . Marital status: Married    Spouse name: Not on file  . Number of children: Not on file  . Years of education: Not on file  . Highest education level: Not on file  Occupational History  . Not on file  Tobacco Use  . Smoking status: Never Smoker  . Smokeless tobacco: Never Used  Substance and Sexual Activity  . Alcohol use: Yes    Comment: occ  . Drug use: Never  . Sexual  activity: Not Currently    Comment: pain with intercourse with husband, will come back for pap  Other Topics Concern  . Not on file  Social History Narrative  . Not on file   Social Determinants of Health   Financial Resource Strain:   . Difficulty of Paying Living Expenses: Not on file  Food Insecurity:   . Worried About Charity fundraiser in the Last Year: Not on file  . Ran Out of Food in the Last Year: Not on file  Transportation Needs:   . Lack of Transportation (Medical): Not on file  . Lack of Transportation (Non-Medical): Not on file  Physical Activity:   . Days of Exercise per Week: Not on file  . Minutes of Exercise per Session: Not on file  Stress:   . Feeling of Stress : Not on file  Social Connections:   . Frequency of Communication with Friends and Family: Not on file  . Frequency of Social Gatherings with Friends and Family: Not on file  . Attends Religious Services: Not on file  . Active Member of Clubs or Organizations: Not on file  . Attends Archivist Meetings: Not on file  . Marital Status: Not on file  Intimate Partner Violence:   . Fear of Current or Ex-Partner: Not on file  . Emotionally Abused: Not on file  . Physically Abused: Not  on file  . Sexually Abused: Not on file    Outpatient Encounter Medications as of 12/21/2019  Medication Sig  . pantoprazole (PROTONIX) 40 MG tablet Take 1 tablet (40 mg total) by mouth daily.  . pravastatin (PRAVACHOL) 10 MG tablet Take 1 tablet (10 mg total) by mouth every other day.  . valsartan (DIOVAN) 160 MG tablet Take 1 tablet (160 mg total) by mouth daily. (Needs to be seen before next refill)  . [DISCONTINUED] pantoprazole (PROTONIX) 40 MG tablet Take 1 tablet (40 mg total) by mouth daily.  . [DISCONTINUED] pravastatin (PRAVACHOL) 10 MG tablet Take 1 tablet (10 mg total) by mouth every other day.  . [DISCONTINUED] valsartan (DIOVAN) 160 MG tablet Take 1 tablet (160 mg total) by mouth daily. (Needs to  be seen before next refill)  . [DISCONTINUED] ipratropium (ATROVENT) 0.06 % nasal spray 2 sprays by Each Nare route Three (3) times a day.   No facility-administered encounter medications on file as of 12/21/2019.    Allergies  Allergen Reactions  . Ibuprofen Rash  . Naproxen Rash    Review of Systems  Constitutional: Negative for activity change, appetite change, chills, diaphoresis, fatigue, fever and unexpected weight change.  HENT: Negative.   Eyes: Negative.  Negative for visual disturbance.  Respiratory: Negative for cough, chest tightness and shortness of breath.   Cardiovascular: Negative for chest pain, palpitations and leg swelling.  Gastrointestinal: Negative for abdominal pain, anal bleeding, blood in stool, constipation, diarrhea, nausea and vomiting.  Endocrine: Negative.   Genitourinary: Negative for decreased urine volume, difficulty urinating, dyspareunia, dysuria, flank pain, frequency, genital sores, hematuria, pelvic pain, urgency, vaginal bleeding, vaginal discharge and vaginal pain.  Musculoskeletal: Negative for arthralgias and myalgias.  Skin: Negative.  Negative for color change and rash.  Allergic/Immunologic: Negative.   Neurological: Negative for dizziness, tremors, seizures, syncope, facial asymmetry, speech difficulty, weakness, light-headedness, numbness and headaches.  Hematological: Negative.  Does not bruise/bleed easily.  Psychiatric/Behavioral: Negative for confusion, hallucinations, sleep disturbance and suicidal ideas.  All other systems reviewed and are negative.       Objective:  BP 131/78 (BP Location: Right Arm, Cuff Size: Normal)   Pulse 64   Temp 98.1 F (36.7 C)   Resp 20   Ht '4\' 9"'  (1.448 m)   Wt 134 lb (60.8 kg)   SpO2 100%   BMI 29.00 kg/m    Wt Readings from Last 3 Encounters:  12/21/19 134 lb (60.8 kg)  06/18/19 130 lb 12.8 oz (59.3 kg)  01/29/19 128 lb 9.6 oz (58.3 kg)    Physical Exam Vitals and nursing note  reviewed.  Constitutional:      General: She is not in acute distress.    Appearance: Normal appearance. She is well-developed, well-groomed and overweight. She is not ill-appearing, toxic-appearing or diaphoretic.  HENT:     Head: Normocephalic and atraumatic.     Jaw: There is normal jaw occlusion.     Right Ear: Hearing normal.     Left Ear: Hearing normal.     Nose: Nose normal.     Mouth/Throat:     Lips: Pink.     Mouth: Mucous membranes are moist.     Pharynx: Oropharynx is clear. Uvula midline.  Eyes:     General: Lids are normal.     Extraocular Movements: Extraocular movements intact.     Conjunctiva/sclera: Conjunctivae normal.     Pupils: Pupils are equal, round, and reactive to light.  Neck:  Thyroid: No thyroid mass, thyromegaly or thyroid tenderness.     Vascular: No carotid bruit or JVD.     Trachea: Trachea and phonation normal.  Cardiovascular:     Rate and Rhythm: Normal rate and regular rhythm.     Chest Wall: PMI is not displaced.     Pulses: Normal pulses.     Heart sounds: Normal heart sounds. No murmur. No friction rub. No gallop.   Pulmonary:     Effort: Pulmonary effort is normal. No respiratory distress.     Breath sounds: Normal breath sounds. No wheezing.  Chest:     Chest wall: No mass, lacerations, deformity, swelling, tenderness, crepitus or edema. There is no dullness to percussion.     Breasts: Breasts are symmetrical.        Right: Normal. No swelling, bleeding, inverted nipple, mass, nipple discharge, skin change or tenderness.        Left: Normal. No swelling, bleeding, inverted nipple, mass, nipple discharge, skin change or tenderness.  Abdominal:     General: Abdomen is protuberant. A surgical scar is present. Bowel sounds are normal. There is no distension or abdominal bruit.     Palpations: Abdomen is soft. There is no hepatomegaly or splenomegaly.     Tenderness: There is no abdominal tenderness. There is no right CVA tenderness or  left CVA tenderness.     Hernia: No hernia is present. There is no hernia in the left inguinal area or right inguinal area.  Genitourinary:    General: Normal vulva.     Exam position: Lithotomy position.     Pubic Area: No rash or pubic lice.      Labia:        Right: No rash, tenderness, lesion or injury.        Left: No rash, tenderness, lesion or injury.      Urethra: No prolapse, urethral pain, urethral swelling or urethral lesion.     Vagina: Normal.     Cervix: Normal.     Uterus: Normal.      Adnexa: Right adnexa normal and left adnexa normal.     Rectum: Normal.    Musculoskeletal:        General: Normal range of motion.     Cervical back: Normal range of motion and neck supple.     Right lower leg: No edema.     Left lower leg: No edema.  Lymphadenopathy:     Cervical: No cervical adenopathy.     Upper Body:     Right upper body: No supraclavicular, axillary or pectoral adenopathy.     Left upper body: No supraclavicular, axillary or pectoral adenopathy.     Lower Body: No right inguinal adenopathy. No left inguinal adenopathy.  Skin:    General: Skin is warm and dry.     Capillary Refill: Capillary refill takes less than 2 seconds.     Coloration: Skin is not cyanotic, jaundiced or pale.     Findings: No rash.  Neurological:     General: No focal deficit present.     Mental Status: She is alert and oriented to person, place, and time.     Cranial Nerves: Cranial nerves are intact. No cranial nerve deficit.     Sensory: Sensation is intact. No sensory deficit.     Motor: Motor function is intact. No weakness.     Coordination: Coordination is intact. Coordination normal.     Gait: Gait is intact. Gait normal.  Deep Tendon Reflexes: Reflexes are normal and symmetric. Reflexes normal.  Psychiatric:        Attention and Perception: Attention and perception normal.        Mood and Affect: Mood and affect normal.        Speech: Speech normal.        Behavior:  Behavior normal. Behavior is cooperative.        Thought Content: Thought content normal.        Cognition and Memory: Cognition and memory normal.        Judgment: Judgment normal.     Results for orders placed or performed in visit on 06/18/19  CBC with Differential/Platelet  Result Value Ref Range   WBC 6.8 3.4 - 10.8 x10E3/uL   RBC 4.82 3.77 - 5.28 x10E6/uL   Hemoglobin 13.6 11.1 - 15.9 g/dL   Hematocrit 42.3 34.0 - 46.6 %   MCV 88 79 - 97 fL   MCH 28.2 26.6 - 33.0 pg   MCHC 32.2 31.5 - 35.7 g/dL   RDW 14.1 11.7 - 15.4 %   Platelets 254 150 - 450 x10E3/uL   Neutrophils 63 Not Estab. %   Lymphs 27 Not Estab. %   Monocytes 8 Not Estab. %   Eos 2 Not Estab. %   Basos 0 Not Estab. %   Neutrophils Absolute 4.2 1.4 - 7.0 x10E3/uL   Lymphocytes Absolute 1.8 0.7 - 3.1 x10E3/uL   Monocytes Absolute 0.6 0.1 - 0.9 x10E3/uL   EOS (ABSOLUTE) 0.2 0.0 - 0.4 x10E3/uL   Basophils Absolute 0.0 0.0 - 0.2 x10E3/uL   Immature Granulocytes 0 Not Estab. %   Immature Grans (Abs) 0.0 0.0 - 0.1 x10E3/uL  CMP14+EGFR  Result Value Ref Range   Glucose 85 65 - 99 mg/dL   BUN 9 6 - 24 mg/dL   Creatinine, Ser 0.69 0.57 - 1.00 mg/dL   GFR calc non Af Amer 98 >59 mL/min/1.73   GFR calc Af Amer 113 >59 mL/min/1.73   BUN/Creatinine Ratio 13 9 - 23   Sodium 141 134 - 144 mmol/L   Potassium 4.2 3.5 - 5.2 mmol/L   Chloride 103 96 - 106 mmol/L   CO2 26 20 - 29 mmol/L   Calcium 9.8 8.7 - 10.2 mg/dL   Total Protein 7.0 6.0 - 8.5 g/dL   Albumin 4.3 3.8 - 4.9 g/dL   Globulin, Total 2.7 1.5 - 4.5 g/dL   Albumin/Globulin Ratio 1.6 1.2 - 2.2   Bilirubin Total 0.3 0.0 - 1.2 mg/dL   Alkaline Phosphatase 92 39 - 117 IU/L   AST 18 0 - 40 IU/L   ALT 21 0 - 32 IU/L  Lipid panel  Result Value Ref Range   Cholesterol, Total 221 (H) 100 - 199 mg/dL   Triglycerides 92 0 - 149 mg/dL   HDL 83 >39 mg/dL   VLDL Cholesterol Cal 18 5 - 40 mg/dL   LDL Calculated 120 (H) 0 - 99 mg/dL   Chol/HDL Ratio 2.7 0.0 - 4.4  ratio       Pertinent labs & imaging results that were available during my care of the patient were reviewed by me and considered in my medical decision making.  Assessment & Plan:  Amy Mccarty was seen today for gynecologic exam.  Diagnoses and all orders for this visit:  Annual physical exam Health maintenance discussed in detail. Labs pending. PAP completed. Mammogram ordered. Will get colonoscopy records.  -     DG Bone  Density; Future -     Lipid panel -     CBC with Differential/Platelet -     CMP14+EGFR -     Thyroid Panel With TSH -     Vitamin D 25 hydroxy -     MM SCREENING BREAST TOMO BILATERAL; Future -     PAP age 25-65  Encounter for gynecological examination without abnormal finding -     PAP age 67-65  Screening for malignant neoplasm of cervix -     PAP age 14-65  Encounter for screening for osteoporosis -     DG Bone Density; Future  Encounter for screening mammogram for malignant neoplasm of breast -     MM SCREENING BREAST TOMO BILATERAL; Future  Vitamin D deficiency -     Vitamin D 25 hydroxy  BMI 29.0-29.9,adult Diet and exercise encouraged. Labs pending.  -     CBC with Differential/Platelet -     CMP14+EGFR -     Thyroid Panel With TSH  Essential hypertension BP well controlled. Changes were not made in regimen today. Goal BP is 130/80. Pt aware to report any persistent high or low readings. DASH diet and exercise encouraged. Exercise at least 150 minutes per week and increase as tolerated. Goal BMI > 25. Stress management encouraged. Avoid nicotine and tobacco product use. Avoid excessive alcohol and NSAID's. Avoid more than 2000 mg of sodium daily. Medications as prescribed. Follow up as scheduled.  -     valsartan (DIOVAN) 160 MG tablet; Take 1 tablet (160 mg total) by mouth daily. (Needs to be seen before next refill)  Gastroesophageal reflux disease without esophagitis -     pantoprazole (PROTONIX) 40 MG tablet; Take 1 tablet (40 mg total) by  mouth daily.  Hyperlipidemia LDL goal <130 Diet encouraged - increase intake of fresh fruits and vegetables, increase intake of lean proteins. Bake, broil, or grill foods. Avoid fried, greasy, and fatty foods. Avoid fast foods. Increase intake of fiber-rich whole grains. Exercise encouraged - at least 150 minutes per week and advance as tolerated.  Goal BMI < 25. Continue medications as prescribed. Follow up in 3-6 months as discussed.  -     Lipid panel -     pravastatin (PRAVACHOL) 10 MG tablet; Take 1 tablet (10 mg total) by mouth every other day.     Continue all other maintenance medications.  Follow up plan: Return in 1 year (on 12/20/2020), or if symptoms worsen or fail to improve.  Continue healthy lifestyle choices, including diet (rich in fruits, vegetables, and lean proteins, and low in salt and simple carbohydrates) and exercise (at least 30 minutes of moderate physical activity daily).  Educational handout given for health maintenance  The above assessment and management plan was discussed with the patient. The patient verbalized understanding of and has agreed to the management plan. Patient is aware to call the clinic if they develop any new symptoms or if symptoms persist or worsen. Patient is aware when to return to the clinic for a follow-up visit. Patient educated on when it is appropriate to go to the emergency department.   Monia Pouch, FNP-C [facility] [phone]

## 2019-12-22 LAB — CMP14+EGFR
ALT: 16 IU/L (ref 0–32)
AST: 17 IU/L (ref 0–40)
Albumin/Globulin Ratio: 1.8 (ref 1.2–2.2)
Albumin: 4.7 g/dL (ref 3.8–4.9)
Alkaline Phosphatase: 102 IU/L (ref 39–117)
BUN/Creatinine Ratio: 15 (ref 9–23)
BUN: 9 mg/dL (ref 6–24)
Bilirubin Total: 0.4 mg/dL (ref 0.0–1.2)
CO2: 25 mmol/L (ref 20–29)
Calcium: 9.9 mg/dL (ref 8.7–10.2)
Chloride: 103 mmol/L (ref 96–106)
Creatinine, Ser: 0.6 mg/dL (ref 0.57–1.00)
GFR calc Af Amer: 118 mL/min/{1.73_m2} (ref >59)
GFR calc non Af Amer: 102 mL/min/{1.73_m2} (ref >59)
Globulin, Total: 2.6 g/dL (ref 1.5–4.5)
Glucose: 89 mg/dL (ref 65–99)
Potassium: 3.8 mmol/L (ref 3.5–5.2)
Sodium: 139 mmol/L (ref 134–144)
Total Protein: 7.3 g/dL (ref 6.0–8.5)

## 2019-12-22 LAB — CBC WITH DIFFERENTIAL/PLATELET
Basophils Absolute: 0 10*3/uL (ref 0.0–0.2)
Basos: 1 %
EOS (ABSOLUTE): 0.2 10*3/uL (ref 0.0–0.4)
Eos: 3 %
Hematocrit: 43 % (ref 34.0–46.6)
Hemoglobin: 14.2 g/dL (ref 11.1–15.9)
Immature Grans (Abs): 0 10*3/uL (ref 0.0–0.1)
Immature Granulocytes: 0 %
Lymphocytes Absolute: 2.1 10*3/uL (ref 0.7–3.1)
Lymphs: 32 %
MCH: 29.2 pg (ref 26.6–33.0)
MCHC: 33 g/dL (ref 31.5–35.7)
MCV: 88 fL (ref 79–97)
Monocytes Absolute: 0.5 10*3/uL (ref 0.1–0.9)
Monocytes: 8 %
Neutrophils Absolute: 3.7 10*3/uL (ref 1.4–7.0)
Neutrophils: 56 %
Platelets: 256 10*3/uL (ref 150–450)
RBC: 4.87 x10E6/uL (ref 3.77–5.28)
RDW: 13 % (ref 11.7–15.4)
WBC: 6.6 10*3/uL (ref 3.4–10.8)

## 2019-12-22 LAB — THYROID PANEL WITH TSH
Free Thyroxine Index: 1.9 (ref 1.2–4.9)
T3 Uptake Ratio: 27 % (ref 24–39)
T4, Total: 7.2 ug/dL (ref 4.5–12.0)
TSH: 1.63 u[IU]/mL (ref 0.450–4.500)

## 2019-12-22 LAB — LIPID PANEL
Chol/HDL Ratio: 2.8 ratio (ref 0.0–4.4)
Cholesterol, Total: 226 mg/dL — ABNORMAL HIGH (ref 100–199)
HDL: 81 mg/dL (ref >39)
LDL Chol Calc (NIH): 131 mg/dL — ABNORMAL HIGH (ref 0–99)
Triglycerides: 80 mg/dL (ref 0–149)
VLDL Cholesterol Cal: 14 mg/dL (ref 5–40)

## 2019-12-22 LAB — VITAMIN D 25 HYDROXY (VIT D DEFICIENCY, FRACTURES): Vit D, 25-Hydroxy: 41.8 ng/mL (ref 30.0–100.0)

## 2019-12-22 NOTE — Progress Notes (Signed)
Total cholesterol is high at 226 with LDL of 131. Limit intake of fried, greasy, fatty, and fast foods. Continue statin therapy along with diet and exercise.  Liver, renal, and thyroid functions normal. CBC, vit D, and glucose normal.

## 2019-12-29 LAB — IGP, APTIMA HPV, RFX 16/18,45: HPV Aptima: POSITIVE — AB

## 2020-01-05 DIAGNOSIS — Z1231 Encounter for screening mammogram for malignant neoplasm of breast: Secondary | ICD-10-CM | POA: Diagnosis not present

## 2020-01-06 ENCOUNTER — Other Ambulatory Visit: Payer: Self-pay | Admitting: Family Medicine

## 2020-01-06 DIAGNOSIS — K219 Gastro-esophageal reflux disease without esophagitis: Secondary | ICD-10-CM

## 2020-01-06 NOTE — Telephone Encounter (Signed)
Ov 02/16/20

## 2020-01-10 DIAGNOSIS — R87610 Atypical squamous cells of undetermined significance on cytologic smear of cervix (ASC-US): Secondary | ICD-10-CM | POA: Diagnosis not present

## 2020-01-13 ENCOUNTER — Telehealth: Payer: Self-pay

## 2020-01-13 NOTE — Telephone Encounter (Signed)
Called patient and left message in regards to mammogram results.  Per [facility] they are waiting on outside films to compare before results are completed.

## 2020-01-17 ENCOUNTER — Telehealth: Payer: Self-pay | Admitting: Family Medicine

## 2020-01-17 NOTE — Telephone Encounter (Signed)
Pt aware to call the pharmacy and to speak with the pharmacist as a new rx was sent over 12/21/19 with 6 refills.

## 2020-01-17 NOTE — Telephone Encounter (Signed)
What is the name of the medication?  Valsartan  Have you contacted your pharmacy to request a refill?  Yes  Which pharmacy would you like this sent to? [facility], [place]  Pt has an appt to see Dr Museum/gallery exhibitions officer for med refill on 02/07/20 because that was first available but says she will run out of medication before her appt.       Patient notified that their request is being sent to the clinical staff for review and that they should receive a call once it is complete. If they do not receive a call within 24 hours they can check with their pharmacy or our office.

## 2020-01-19 DIAGNOSIS — R928 Other abnormal and inconclusive findings on diagnostic imaging of breast: Secondary | ICD-10-CM

## 2020-01-19 NOTE — Progress Notes (Signed)
Abnormal/incomplete screening mammograms - standing orders placed per recommendations.  Signed hard copy of orders faxed to [facility] - their office will contact patient to make appointment.

## 2020-01-27 DIAGNOSIS — N898 Other specified noninflammatory disorders of vagina: Secondary | ICD-10-CM | POA: Diagnosis not present

## 2020-01-27 DIAGNOSIS — R87611 Atypical squamous cells cannot exclude high grade squamous intraepithelial lesion on cytologic smear of cervix (ASC-H): Secondary | ICD-10-CM | POA: Diagnosis not present

## 2020-01-27 DIAGNOSIS — K644 Residual hemorrhoidal skin tags: Secondary | ICD-10-CM | POA: Diagnosis not present

## 2020-02-02 DIAGNOSIS — R928 Other abnormal and inconclusive findings on diagnostic imaging of breast: Secondary | ICD-10-CM | POA: Diagnosis not present

## 2020-02-15 ENCOUNTER — Other Ambulatory Visit: Payer: Self-pay

## 2020-02-16 ENCOUNTER — Ambulatory Visit: Payer: BLUE CROSS/BLUE SHIELD

## 2020-02-16 ENCOUNTER — Ambulatory Visit (INDEPENDENT_AMBULATORY_CARE_PROVIDER_SITE_OTHER): Payer: BLUE CROSS/BLUE SHIELD

## 2020-02-16 ENCOUNTER — Ambulatory Visit: Payer: BLUE CROSS/BLUE SHIELD | Admitting: Family Medicine

## 2020-02-16 ENCOUNTER — Encounter: Payer: Self-pay | Admitting: Family Medicine

## 2020-02-16 VITALS — BP 145/84 | HR 64 | Temp 98.4°F | Ht <= 58 in | Wt 131.4 lb

## 2020-02-16 DIAGNOSIS — K219 Gastro-esophageal reflux disease without esophagitis: Secondary | ICD-10-CM | POA: Diagnosis not present

## 2020-02-16 DIAGNOSIS — Z1159 Encounter for screening for other viral diseases: Secondary | ICD-10-CM

## 2020-02-16 DIAGNOSIS — Z13820 Encounter for screening for osteoporosis: Secondary | ICD-10-CM

## 2020-02-16 DIAGNOSIS — Z780 Asymptomatic menopausal state: Secondary | ICD-10-CM | POA: Diagnosis not present

## 2020-02-16 DIAGNOSIS — I10 Essential (primary) hypertension: Secondary | ICD-10-CM | POA: Diagnosis not present

## 2020-02-16 DIAGNOSIS — E785 Hyperlipidemia, unspecified: Secondary | ICD-10-CM | POA: Diagnosis not present

## 2020-02-16 DIAGNOSIS — Z1211 Encounter for screening for malignant neoplasm of colon: Secondary | ICD-10-CM

## 2020-02-16 MED ORDER — PANTOPRAZOLE SODIUM 40 MG PO TBEC: DELAYED_RELEASE_TABLET | ORAL | 3 refills | Status: DC

## 2020-02-16 NOTE — Progress Notes (Signed)
BP (!) 145/84   Pulse 64   Temp 98.4 F (36.9 C) (Temporal)   Ht '4\' 9"'  (1.448 m)   Wt 131 lb 6.4 oz (59.6 kg)   SpO2 100%   BMI 28.43 kg/m    Subjective:   Patient ID: Amy Mccarty, female    DOB: 1961-06-19, 59 y.o.   MRN: [id number]  HPI: Amy Mccarty is a 59 y.o. female presenting on 02/16/2020 for Hyperlipidemia (check up )   HPI Hyperlipidemia Patient is coming in for recheck of his hyperlipidemia. The patient is currently taking pravastatin. They deny any issues with myalgias or history of liver damage from it. They deny any focal numbness or weakness or chest pain.   Hypertension Patient is currently on valsartan, and their blood pressure today is 145/84 but she says at home shows runs in the 120s to 130s.. Patient denies any lightheadedness or dizziness. Patient denies headaches, blurred vision, chest pains, shortness of breath, or weakness. Denies any side effects from medication and is content with current medication.   GERD Patient is currently on pantoprazole.  She denies any major symptoms or abdominal pain or belching or burping. She denies any blood in her stool or lightheadedness or dizziness.   Relevant past medical, surgical, family and social history reviewed and updated as indicated. Interim medical history since our last visit reviewed. Allergies and medications reviewed and updated.  Review of Systems  Constitutional: Negative for chills and fever.  HENT: Negative for congestion, ear discharge and ear pain.   Eyes: Negative for redness and visual disturbance.  Respiratory: Negative for chest tightness and shortness of breath.   Cardiovascular: Negative for chest pain and leg swelling.  Genitourinary: Negative for difficulty urinating and dysuria.  Musculoskeletal: Negative for back pain and gait problem.  Skin: Negative for rash.  Neurological: Negative for light-headedness and headaches.  Psychiatric/Behavioral: Negative for agitation and  behavioral problems.  All other systems reviewed and are negative.   Per HPI unless specifically indicated above   Allergies as of 02/16/2020      Reactions   Ibuprofen Rash   Naproxen Rash      Medication List       Accurate as of February 16, 2020  1:49 PM. If you have any questions, ask your nurse or doctor.        folic acid 1 MG tablet Commonly known as: FOLVITE Take 1 mg by mouth daily.   pantoprazole 40 MG tablet Commonly known as: PROTONIX TAKE 1 TABLET(40 MG) BY MOUTH DAILY   pravastatin 10 MG tablet Commonly known as: PRAVACHOL Take 1 tablet (10 mg total) by mouth every other day.   valsartan 160 MG tablet Commonly known as: DIOVAN Take 1 tablet (160 mg total) by mouth daily. (Needs to be seen before next refill)        Objective:   BP (!) 145/84   Pulse 64   Temp 98.4 F (36.9 C) (Temporal)   Ht '4\' 9"'  (1.448 m)   Wt 131 lb 6.4 oz (59.6 kg)   SpO2 100%   BMI 28.43 kg/m   Wt Readings from Last 3 Encounters:  02/16/20 131 lb 6.4 oz (59.6 kg)  12/21/19 134 lb (60.8 kg)  06/18/19 130 lb 12.8 oz (59.3 kg)    Physical Exam Vitals and nursing note reviewed.  Constitutional:      General: She is not in acute distress.    Appearance: She is well-developed. She is not diaphoretic.  Eyes:  Conjunctiva/sclera: Conjunctivae normal.     Pupils: Pupils are equal, round, and reactive to light.  Cardiovascular:     Rate and Rhythm: Normal rate and regular rhythm.     Heart sounds: Normal heart sounds. No murmur.  Pulmonary:     Effort: Pulmonary effort is normal. No respiratory distress.     Breath sounds: Normal breath sounds. No wheezing.  Musculoskeletal:        General: No tenderness. Normal range of motion.  Skin:    General: Skin is warm and dry.     Findings: No rash.  Neurological:     Mental Status: She is alert and oriented to person, place, and time.     Coordination: Coordination normal.  Psychiatric:        Behavior: Behavior  normal.       Assessment & Plan:   Problem List Items Addressed This Visit      Cardiovascular and Mediastinum   Essential hypertension   Relevant Orders   CMP14+EGFR     Digestive   GERD (gastroesophageal reflux disease)   Relevant Medications   pantoprazole (PROTONIX) 40 MG tablet   Other Relevant Orders   CBC with Differential/Platelet     Other   Hyperlipidemia LDL goal <130 - Primary   Relevant Orders   Lipid panel    Other Visit Diagnoses    Colon cancer screening       Relevant Orders   Cologuard   Osteoporosis screening       Relevant Orders   DG [facility] DEXA   Need for hepatitis C screening test       Relevant Orders   Hepatitis C antibody      Blood pressure sounds good at home, will do blood work, continue current medication we will see back in 6 months Follow up plan: Return in about 6 months (around 08/15/2020), or if symptoms worsen or fail to improve, for Hypertension and cholesterol.  Counseling provided for all of the vaccine components Orders Placed This Encounter  Procedures  . DG [facility] DEXA  . Cologuard  . CBC with Differential/Platelet  . CMP14+EGFR  . Lipid panel  . Hepatitis C antibody    Caryl Pina, MD [facility] 02/16/2020, 1:49 PM

## 2020-02-17 LAB — CBC WITH DIFFERENTIAL/PLATELET
Basophils Absolute: 0 10*3/uL (ref 0.0–0.2)
Basos: 1 %
EOS (ABSOLUTE): 0.1 10*3/uL (ref 0.0–0.4)
Eos: 2 %
Hematocrit: 41 % (ref 34.0–46.6)
Hemoglobin: 14.1 g/dL (ref 11.1–15.9)
Immature Grans (Abs): 0 10*3/uL (ref 0.0–0.1)
Immature Granulocytes: 0 %
Lymphocytes Absolute: 1.9 10*3/uL (ref 0.7–3.1)
Lymphs: 30 %
MCH: 29.3 pg (ref 26.6–33.0)
MCHC: 34.4 g/dL (ref 31.5–35.7)
MCV: 85 fL (ref 79–97)
Monocytes Absolute: 0.6 10*3/uL (ref 0.1–0.9)
Monocytes: 10 %
Neutrophils Absolute: 3.7 10*3/uL (ref 1.4–7.0)
Neutrophils: 57 %
Platelets: 257 10*3/uL (ref 150–450)
RBC: 4.81 x10E6/uL (ref 3.77–5.28)
RDW: 12.8 % (ref 11.7–15.4)
WBC: 6.4 10*3/uL (ref 3.4–10.8)

## 2020-02-17 LAB — CMP14+EGFR
ALT: 14 IU/L (ref 0–32)
AST: 19 IU/L (ref 0–40)
Albumin/Globulin Ratio: 1.7 (ref 1.2–2.2)
Albumin: 4.7 g/dL (ref 3.8–4.9)
Alkaline Phosphatase: 89 IU/L (ref 39–117)
BUN/Creatinine Ratio: 14 (ref 9–23)
BUN: 12 mg/dL (ref 6–24)
Bilirubin Total: 0.3 mg/dL (ref 0.0–1.2)
CO2: 24 mmol/L (ref 20–29)
Calcium: 9.4 mg/dL (ref 8.7–10.2)
Chloride: 105 mmol/L (ref 96–106)
Creatinine, Ser: 0.83 mg/dL (ref 0.57–1.00)
GFR calc Af Amer: 91 mL/min/{1.73_m2} (ref >59)
GFR calc non Af Amer: 79 mL/min/{1.73_m2} (ref >59)
Globulin, Total: 2.7 g/dL (ref 1.5–4.5)
Glucose: 90 mg/dL (ref 65–99)
Potassium: 4.2 mmol/L (ref 3.5–5.2)
Sodium: 143 mmol/L (ref 134–144)
Total Protein: 7.4 g/dL (ref 6.0–8.5)

## 2020-02-17 LAB — LIPID PANEL
Chol/HDL Ratio: 2.5 ratio (ref 0.0–4.4)
Cholesterol, Total: 192 mg/dL (ref 100–199)
HDL: 76 mg/dL (ref >39)
LDL Chol Calc (NIH): 102 mg/dL — ABNORMAL HIGH (ref 0–99)
Triglycerides: 79 mg/dL (ref 0–149)
VLDL Cholesterol Cal: 14 mg/dL (ref 5–40)

## 2020-02-17 LAB — HEPATITIS C ANTIBODY: Hep C Virus Ab: <0.1 s/co ratio (ref 0.0–0.9)

## 2020-02-28 DIAGNOSIS — Z1211 Encounter for screening for malignant neoplasm of colon: Secondary | ICD-10-CM | POA: Diagnosis not present

## 2020-03-03 LAB — COLOGUARD: COLOGUARD: NEGATIVE

## 2020-03-07 ENCOUNTER — Ambulatory Visit: Payer: BLUE CROSS/BLUE SHIELD | Attending: Internal Medicine

## 2020-03-07 ENCOUNTER — Other Ambulatory Visit: Payer: Self-pay

## 2020-03-07 DIAGNOSIS — U071 COVID-19: Secondary | ICD-10-CM | POA: Insufficient documentation

## 2020-03-07 DIAGNOSIS — Z20822 Contact with and (suspected) exposure to covid-19: Secondary | ICD-10-CM

## 2020-03-07 LAB — COLOGUARD: Cologuard: NEGATIVE

## 2020-03-08 LAB — NOVEL CORONAVIRUS, NAA: SARS-CoV-2, NAA: DETECTED — AB

## 2020-03-09 ENCOUNTER — Telehealth: Payer: Self-pay | Admitting: Physician Assistant

## 2020-03-09 ENCOUNTER — Telehealth: Payer: Self-pay | Admitting: Family Medicine

## 2020-03-09 NOTE — Telephone Encounter (Signed)
Called to discuss with [name] about Covid symptoms and the use of bamlanivimab or casirivimab/imdevimab, a monoclonal antibody infusion for those with mild to moderate Covid symptoms and at a high risk of hospitalization.     Pt is qualified for this infusion at the [facility] infusion center due to co-morbid conditions and/or a member of an at-risk group (HTN and age >76), however declines infusion at this time. She would like ot talk to PCP and read more about it. Symptoms tier reviewed as well as criteria for ending isolation.  Symptoms reviewed that would warrant ED/Hospital evaluation. Preventative practices reviewed. Patient verbalized understanding. Patient advised to call back if he decides that he does want to get infusion. Callback number to the infusion center given. Patient advised to go to Urgent care or ED with severe symptoms. Last date pt would be eligible for infusion is 03/17/20.      Patient Active Problem List   Diagnosis Date Noted  . Essential hypertension 12/21/2019  . BMI 29.0-29.9,adult 06/18/2019  . Skin tag of perianal region 12/07/2018  . Refuses tetanus, diphtheria, and acellular pertussis (Tdap) vaccination 12/07/2018  . Refused influenza vaccine 12/07/2018  . Dyspareunia in female 12/07/2018  . Skin tags, multiple acquired 12/07/2018  . Hyperlipidemia LDL goal <130 11/30/2018  . GERD (gastroesophageal reflux disease) 11/30/2018    Cline Crock PA-C

## 2020-03-10 NOTE — Telephone Encounter (Signed)
Spoke with patient and she is not having very many symptoms at all and does not desire to go do it at this point but I told her that if she develops any worsening symptoms over the weekend to go ahead and get the infusion and she understood and was agreeable.

## 2020-06-07 DIAGNOSIS — H04123 Dry eye syndrome of bilateral lacrimal glands: Secondary | ICD-10-CM | POA: Diagnosis not present

## 2020-06-07 DIAGNOSIS — H40033 Anatomical narrow angle, bilateral: Secondary | ICD-10-CM | POA: Diagnosis not present

## 2020-07-13 DIAGNOSIS — R87612 Low grade squamous intraepithelial lesion on cytologic smear of cervix (LGSIL): Secondary | ICD-10-CM | POA: Diagnosis not present

## 2020-07-13 DIAGNOSIS — Z124 Encounter for screening for malignant neoplasm of cervix: Secondary | ICD-10-CM | POA: Diagnosis not present

## 2020-07-13 DIAGNOSIS — N952 Postmenopausal atrophic vaginitis: Secondary | ICD-10-CM | POA: Diagnosis not present

## 2020-07-13 DIAGNOSIS — Z1151 Encounter for screening for human papillomavirus (HPV): Secondary | ICD-10-CM | POA: Diagnosis not present

## 2020-07-13 DIAGNOSIS — Z01419 Encounter for gynecological examination (general) (routine) without abnormal findings: Secondary | ICD-10-CM | POA: Diagnosis not present

## 2020-08-01 ENCOUNTER — Other Ambulatory Visit: Payer: Self-pay | Admitting: Family Medicine

## 2020-08-01 ENCOUNTER — Other Ambulatory Visit: Payer: Self-pay | Admitting: *Deleted

## 2020-08-01 DIAGNOSIS — I10 Essential (primary) hypertension: Secondary | ICD-10-CM

## 2020-08-01 MED ORDER — VALSARTAN 160 MG PO TABS: 160.0000 mg | ORAL_TABLET | Freq: Every day | ORAL | 0 refills | Status: DC

## 2020-08-28 ENCOUNTER — Ambulatory Visit: Payer: BLUE CROSS/BLUE SHIELD | Admitting: Family Medicine

## 2020-09-27 ENCOUNTER — Encounter: Payer: Self-pay | Admitting: Family Medicine

## 2020-09-27 ENCOUNTER — Ambulatory Visit: Payer: BLUE CROSS/BLUE SHIELD | Admitting: Family Medicine

## 2020-09-27 ENCOUNTER — Other Ambulatory Visit: Payer: Self-pay

## 2020-09-27 VITALS — BP 137/79 | HR 75 | Temp 98.0°F | Ht <= 58 in | Wt 130.0 lb

## 2020-09-27 DIAGNOSIS — I10 Essential (primary) hypertension: Secondary | ICD-10-CM | POA: Diagnosis not present

## 2020-09-27 DIAGNOSIS — K219 Gastro-esophageal reflux disease without esophagitis: Secondary | ICD-10-CM

## 2020-09-27 DIAGNOSIS — E785 Hyperlipidemia, unspecified: Secondary | ICD-10-CM | POA: Diagnosis not present

## 2020-09-27 DIAGNOSIS — R102 Pelvic and perineal pain: Secondary | ICD-10-CM

## 2020-09-27 LAB — URINALYSIS, COMPLETE
Bilirubin, UA: NEGATIVE
Glucose, UA: NEGATIVE
Ketones, UA: NEGATIVE
Nitrite, UA: NEGATIVE
Protein,UA: NEGATIVE
RBC, UA: NEGATIVE
Specific Gravity, UA: 1.025 (ref 1.005–1.030)
Urobilinogen, Ur: 0.2 mg/dL (ref 0.2–1.0)
pH, UA: 5.5 (ref 5.0–7.5)

## 2020-09-27 LAB — MICROSCOPIC EXAMINATION
Bacteria, UA: NONE SEEN
RBC, Urine: NONE SEEN /hpf (ref 0–2)

## 2020-09-27 MED ORDER — VALSARTAN 160 MG PO TABS: ORAL_TABLET | ORAL | 3 refills | Status: DC

## 2020-09-27 MED ORDER — PANTOPRAZOLE SODIUM 40 MG PO TBEC: DELAYED_RELEASE_TABLET | ORAL | 3 refills | Status: DC

## 2020-09-27 MED ORDER — PRAVASTATIN SODIUM 10 MG PO TABS: 10.0000 mg | ORAL_TABLET | ORAL | 3 refills | Status: DC

## 2020-09-27 NOTE — Progress Notes (Signed)
BP 137/79   Pulse 75   Temp 98 F (36.7 C)   Ht '4\' 9"'  (1.448 m)   Wt 130 lb (59 kg)   SpO2 99%   BMI 28.13 kg/m    Subjective:   Patient ID: Amy Mccarty, female    DOB: [birth year], 59 y.o.   MRN: [id number]  HPI: Amy Mccarty is a 59 y.o. female presenting on 09/27/2020 for Hypertension, Hyperlipidemia, and Gastroesophageal Reflux   HPI Hypertension Patient is currently on valsartan, and their blood pressure today is 137/79. Patient denies any lightheadedness or dizziness. Patient denies headaches, blurred vision, chest pains, shortness of breath, or weakness. Denies any side effects from medication and is content with current medication.   Hyperlipidemia Patient is coming in for recheck of his hyperlipidemia. The patient is currently taking pravastatin. They deny any issues with myalgias or history of liver damage from it. They deny any focal numbness or weakness or chest pain.   GERD Patient is currently on pantoprazole.  She denies any major symptoms or abdominal pain or belching or burping. She denies any blood in her stool or lightheadedness or dizziness. r  Relevant past medical, surgical, family and social history reviewed and updated as indicated. Interim medical history since our last visit reviewed. Allergies and medications reviewed and updated.  Review of Systems  Constitutional: Negative for chills and fever.  Eyes: Negative for visual disturbance.  Respiratory: Negative for chest tightness and shortness of breath.   Cardiovascular: Negative for chest pain and leg swelling.  Musculoskeletal: Negative for back pain and gait problem.  Skin: Negative for rash.  Neurological: Positive for headaches (Intermittent occasional headaches that passed very quickly). Negative for dizziness and light-headedness.  Psychiatric/Behavioral: Negative for agitation and behavioral problems.  All other systems reviewed and are negative.   Per HPI unless specifically  indicated above   Allergies as of 09/27/2020      Reactions   Ibuprofen Rash   Naproxen Rash      Medication List       Accurate as of September 27, 2020  1:45 PM. If you have any questions, ask your nurse or doctor.        folic acid 1 MG tablet Commonly known as: FOLVITE Take 1 mg by mouth daily.   pantoprazole 40 MG tablet Commonly known as: PROTONIX TAKE 1 TABLET(40 MG) BY MOUTH DAILY   pravastatin 10 MG tablet Commonly known as: PRAVACHOL Take 1 tablet (10 mg total) by mouth every other day.   valsartan 160 MG tablet Commonly known as: DIOVAN TAKE 1 TABLET(160 MG) BY MOUTH DAILY        Objective:   BP 137/79   Pulse 75   Temp 98 F (36.7 C)   Ht '4\' 9"'  (1.448 m)   Wt 130 lb (59 kg)   SpO2 99%   BMI 28.13 kg/m   Wt Readings from Last 3 Encounters:  09/27/20 130 lb (59 kg)  02/16/20 131 lb 6.4 oz (59.6 kg)  12/21/19 134 lb (60.8 kg)    Physical Exam Vitals and nursing note reviewed.  Constitutional:      General: She is not in acute distress.    Appearance: She is well-developed. She is not diaphoretic.  Eyes:     Conjunctiva/sclera: Conjunctivae normal.  Cardiovascular:     Rate and Rhythm: Normal rate and regular rhythm.     Heart sounds: Normal heart sounds. No murmur heard.   Pulmonary:     Effort: Pulmonary  effort is normal. No respiratory distress.     Breath sounds: Normal breath sounds. No wheezing.  Musculoskeletal:        General: No tenderness. Normal range of motion.  Skin:    General: Skin is warm and dry.     Findings: No rash.  Neurological:     Mental Status: She is alert and oriented to person, place, and time.     Coordination: Coordination normal.  Psychiatric:        Behavior: Behavior normal.       Assessment & Plan:   Problem List Items Addressed This Visit      Cardiovascular and Mediastinum   Essential hypertension   Relevant Medications   valsartan (DIOVAN) 160 MG tablet   pravastatin (PRAVACHOL) 10 MG  tablet   Other Relevant Orders   CMP14+EGFR     Digestive   GERD (gastroesophageal reflux disease)   Relevant Medications   pantoprazole (PROTONIX) 40 MG tablet   Other Relevant Orders   CBC with Differential/Platelet     Other   Hyperlipidemia LDL goal <130 - Primary   Relevant Medications   valsartan (DIOVAN) 160 MG tablet   pravastatin (PRAVACHOL) 10 MG tablet   Other Relevant Orders   Lipid panel    Other Visit Diagnoses    Pelvic pressure in female       Relevant Orders   Urinalysis, Complete     Continue current medication, will check urine because of continued pelvic pressure and pain, comes and goes.,  Did already see gynecology.  No change in blood pressure medication or cholesterol, will check blood work. Follow up plan: Return in about 6 months (around 03/27/2021), or if symptoms worsen or fail to improve, for htn gerd and hld.  Counseling provided for all of the vaccine components No orders of the defined types were placed in this encounter.   Amy Pina, MD [facility] 09/27/2020, 1:45 PM

## 2020-09-28 LAB — CBC WITH DIFFERENTIAL/PLATELET
Basophils Absolute: 0 10*3/uL (ref 0.0–0.2)
Basos: 1 %
EOS (ABSOLUTE): 0.3 10*3/uL (ref 0.0–0.4)
Eos: 4 %
Hematocrit: 40.2 % (ref 34.0–46.6)
Hemoglobin: 13.1 g/dL (ref 11.1–15.9)
Immature Grans (Abs): 0 10*3/uL (ref 0.0–0.1)
Immature Granulocytes: 0 %
Lymphocytes Absolute: 2.1 10*3/uL (ref 0.7–3.1)
Lymphs: 30 %
MCH: 28.4 pg (ref 26.6–33.0)
MCHC: 32.6 g/dL (ref 31.5–35.7)
MCV: 87 fL (ref 79–97)
Monocytes Absolute: 0.5 10*3/uL (ref 0.1–0.9)
Monocytes: 7 %
Neutrophils Absolute: 4.1 10*3/uL (ref 1.4–7.0)
Neutrophils: 58 %
Platelets: 252 10*3/uL (ref 150–450)
RBC: 4.62 x10E6/uL (ref 3.77–5.28)
RDW: 13.1 % (ref 11.7–15.4)
WBC: 7.1 10*3/uL (ref 3.4–10.8)

## 2020-09-28 LAB — CMP14+EGFR
ALT: 15 IU/L (ref 0–32)
AST: 16 IU/L (ref 0–40)
Albumin/Globulin Ratio: 1.6 (ref 1.2–2.2)
Albumin: 4.5 g/dL (ref 3.8–4.9)
Alkaline Phosphatase: 112 IU/L (ref 44–121)
BUN/Creatinine Ratio: 15 (ref 9–23)
BUN: 11 mg/dL (ref 6–24)
Bilirubin Total: 0.3 mg/dL (ref 0.0–1.2)
CO2: 24 mmol/L (ref 20–29)
Calcium: 9.4 mg/dL (ref 8.7–10.2)
Chloride: 102 mmol/L (ref 96–106)
Creatinine, Ser: 0.72 mg/dL (ref 0.57–1.00)
GFR calc Af Amer: 108 mL/min/{1.73_m2} (ref >59)
GFR calc non Af Amer: 93 mL/min/{1.73_m2} (ref >59)
Globulin, Total: 2.9 g/dL (ref 1.5–4.5)
Glucose: 107 mg/dL — ABNORMAL HIGH (ref 65–99)
Potassium: 4.1 mmol/L (ref 3.5–5.2)
Sodium: 139 mmol/L (ref 134–144)
Total Protein: 7.4 g/dL (ref 6.0–8.5)

## 2020-09-28 LAB — LIPID PANEL
Chol/HDL Ratio: 3.2 ratio (ref 0.0–4.4)
Cholesterol, Total: 211 mg/dL — ABNORMAL HIGH (ref 100–199)
HDL: 65 mg/dL (ref >39)
LDL Chol Calc (NIH): 119 mg/dL — ABNORMAL HIGH (ref 0–99)
Triglycerides: 157 mg/dL — ABNORMAL HIGH (ref 0–149)
VLDL Cholesterol Cal: 27 mg/dL (ref 5–40)

## 2020-10-16 ENCOUNTER — Ambulatory Visit: Payer: BLUE CROSS/BLUE SHIELD | Admitting: Family Medicine

## 2020-10-16 ENCOUNTER — Other Ambulatory Visit: Payer: Self-pay

## 2020-10-16 ENCOUNTER — Encounter: Payer: Self-pay | Admitting: Family Medicine

## 2020-10-16 VITALS — BP 132/80 | HR 68 | Temp 98.3°F | Ht <= 58 in | Wt 131.0 lb

## 2020-10-16 DIAGNOSIS — I10 Essential (primary) hypertension: Secondary | ICD-10-CM

## 2020-10-16 DIAGNOSIS — G2581 Restless legs syndrome: Secondary | ICD-10-CM

## 2020-10-16 DIAGNOSIS — E785 Hyperlipidemia, unspecified: Secondary | ICD-10-CM | POA: Diagnosis not present

## 2020-10-16 DIAGNOSIS — R519 Headache, unspecified: Secondary | ICD-10-CM

## 2020-10-16 DIAGNOSIS — R739 Hyperglycemia, unspecified: Secondary | ICD-10-CM

## 2020-10-16 DIAGNOSIS — G4730 Sleep apnea, unspecified: Secondary | ICD-10-CM

## 2020-10-16 LAB — BAYER DCA HB A1C WAIVED: HB A1C (BAYER DCA - WAIVED): 6.7 % (ref <7.0)

## 2020-10-16 MED ORDER — PRAMIPEXOLE DIHYDROCHLORIDE 0.125 MG PO TABS: 0.1250 mg | ORAL_TABLET | Freq: Every day | ORAL | 3 refills | Status: DC

## 2020-10-16 NOTE — Progress Notes (Signed)
BP 132/80   Pulse 68   Temp 98.3 F (36.8 C)   Ht 4\' 9"  (1.448 m)   Wt 131 lb (59.4 kg)   SpO2 99%   BMI 28.35 kg/m    Subjective:   Patient ID: , female    DOB: 1961/11/07, 59 y.o.   MRN: [id number]  HPI: Amy Mccarty is a 59 y.o. female presenting on 10/16/2020 for Medical Management of Chronic Issues, Hypertension, Insomnia, and Migraine   HPI Hypertension Patient is currently on valsartan, and their blood pressure today is 132/80. Patient denies any lightheadedness or dizziness. Patient denies headaches, blurred vision, chest pains, shortness of breath, or weakness. Denies any side effects from medication and is content with current medication.   Hyperlipidemia Patient is coming in for recheck of his hyperlipidemia. The patient is currently taking pravastatin. They deny any issues with myalgias or history of liver damage from it. They deny any focal numbness or weakness or chest pain.   Insomnia Patient is coming in complaining of migraines and insomnia.  She says migraines when she wakes up in the morning and insomnia is happening because she is getting these weird sensations and just cannot sleep.  She does admit that she was diagnosed with sleep apnea in the past but has not been on a machine because she could not tolerate it, this was [place] 10[place].  Patient says she gets this weird shooting sensations over her body when she sleeps then will go away.  She does admit that she was diagnosed with this in the past.  Relevant past medical, surgical, family and social history reviewed and updated as indicated. Interim medical history since our last visit reviewed. Allergies and medications reviewed and updated.  Review of Systems  Constitutional: Negative for chills and fever.  Eyes: Negative for redness and visual disturbance.  Respiratory: Negative for chest tightness and shortness of breath.   Cardiovascular: Negative for chest pain,  palpitations and leg swelling.  Musculoskeletal: Negative for back pain and gait problem.  Skin: Negative for rash.  Neurological: Positive for headaches. Negative for weakness, light-headedness and numbness.  Psychiatric/Behavioral: Positive for sleep disturbance. Negative for agitation and behavioral problems.  All other systems reviewed and are negative.   Per HPI unless specifically indicated above   Allergies as of 10/16/2020      Reactions   Ibuprofen Rash   Naproxen Rash      Medication List       Accurate as of October 16, 2020  3:14 PM. If you have any questions, ask your nurse or doctor.        folic acid 1 MG tablet Commonly known as: FOLVITE Take 1 mg by mouth daily.   pantoprazole 40 MG tablet Commonly known as: PROTONIX TAKE 1 TABLET(40 MG) BY MOUTH DAILY   pramipexole 0.125 MG tablet Commonly known as: Mirapex Take 1 tablet (0.125 mg total) by mouth at bedtime. Started by: October 18, 2020, MD   pravastatin 10 MG tablet Commonly known as: PRAVACHOL Take 1 tablet (10 mg total) by mouth every other day.   valsartan 160 MG tablet Commonly known as: DIOVAN TAKE 1 TABLET(160 MG) BY MOUTH DAILY        Objective:   BP 132/80   Pulse 68   Temp 98.3 F (36.8 C)   Ht 4\' 9"  (1.448 m)   Wt 131 lb (59.4 kg)   SpO2 99%   BMI 28.35 kg/m   Wt Readings  from Last 3 Encounters:  10/16/20 131 lb (59.4 kg)  09/27/20 130 lb (59 kg)  02/16/20 131 lb 6.4 oz (59.6 kg)    Physical Exam Vitals and nursing note reviewed.  Constitutional:      General: She is not in acute distress.    Appearance: She is well-developed. She is not diaphoretic.  Eyes:     Extraocular Movements: Extraocular movements intact.     Conjunctiva/sclera: Conjunctivae normal.     Pupils: Pupils are equal, round, and reactive to light.  Cardiovascular:     Rate and Rhythm: Normal rate and regular rhythm.     Heart sounds: Normal heart sounds. No murmur heard.   Pulmonary:      Effort: Pulmonary effort is normal. No respiratory distress.     Breath sounds: Normal breath sounds. No wheezing.  Musculoskeletal:        General: No tenderness. Normal range of motion.  Skin:    General: Skin is warm and dry.     Findings: No rash.  Neurological:     General: No focal deficit present.     Mental Status: She is alert and oriented to person, place, and time. Mental status is at baseline.     Sensory: No sensory deficit.     Motor: No weakness.     Coordination: Coordination normal.     Gait: Gait normal.  Psychiatric:        Behavior: Behavior normal.       Assessment & Plan:   Problem List Items Addressed This Visit      Cardiovascular and Mediastinum   Essential hypertension     Other   Hyperlipidemia LDL goal <130 - Primary    Other Visit Diagnoses    Elevated blood sugar       Relevant Orders   Bayer DCA Hb A1c Waived   Frequent headaches       Relevant Orders   Ambulatory referral to Neurology   Sleep apnea, unspecified type       Relevant Orders   Ambulatory referral to Neurology   Restless leg syndrome       Relevant Orders   Ambulatory referral to Neurology      Referral for sleep testing, will also try Mirapex to help with restless legs. Seems like her symptoms could be related to sleep apnea or a restless leg syndrome, will refer to neurology for further evaluation, may need sleep study again. Follow up plan: Return in about 6 months (around 04/16/2021), or if symptoms worsen or fail to improve, for Hypertension and hyperlipidemia recheck.  Counseling provided for all of the vaccine components Orders Placed This Encounter  Procedures  . Bayer DCA Hb A1c Waived  . Ambulatory referral to Neurology    Arville Care, MD [facility] 10/16/2020, 3:14 PM

## 2020-10-18 ENCOUNTER — Encounter: Payer: Self-pay | Admitting: Family Medicine

## 2020-10-24 ENCOUNTER — Ambulatory Visit: Payer: BLUE CROSS/BLUE SHIELD | Admitting: Pharmacist

## 2020-10-24 ENCOUNTER — Other Ambulatory Visit: Payer: Self-pay

## 2020-10-24 DIAGNOSIS — E119 Type 2 diabetes mellitus without complications: Secondary | ICD-10-CM

## 2020-10-24 NOTE — Progress Notes (Signed)
    10/24/2020 Name: Amy Mccarty MRN: [id number] DOB: 08-Dec-1961   S:  9 yoF Presents for diabetes evaluation, education, and management.  She has been recently diagnosed and patient is not currently on medication to treat diabetes.  She is interested in diet and exercise Patient was referred and last seen by Primary Care Provider on 10/16/20.  Insurance coverage/medication affordability: BCBS  Patient reports adherence with medications. . Current diabetes medications include: n/a Current hypertension medications include: valsartan Goal 130/80 . Current hyperlipidemia medications include: pravastatin    Patient reported dietary habits: Eats 2-3 meals/day Discussed meal planning options and Plate method for healthy eating . Avoid sugary drinks and desserts . Incorporate balanced protein, non starchy veggies, 1 serving of carbohydrate with each meal . Increase water intake . Increase physical activity as able The patient is asked to make an attempt to improve diet and exercise patterns to aid in medical management of this problem.  Patient-reported exercise habits: difficult with fibromyalgia, however encouraged patient as able  O:  Lab Results  Component Value Date   HGBA1C 6.7 10/16/2020    Lipid Panel     Component Value Date/Time   CHOL 211 (H) 09/27/2020 1401   TRIG 157 (H) 09/27/2020 1401   HDL 65 09/27/2020 1401   CHOLHDL 3.2 09/27/2020 1401   LDLCALC 119 (H) 09/27/2020 1401    Home fasting blood sugars: n/a  2 hour post-meal/random blood sugars: 78.     A/P:  Diabetes T2DM, new onset.  Patient has been caring for her brother who has diabetes, therefore she is familiar with checking blood sugar, watching carbohydrate intake, etc.  -Will not start medication at this time.  Patient would like to try diet/lifestyle  -Extensively discussed pathophysiology of diabetes, recommended lifestyle interventions, dietary effects on blood sugar control  -Counseled  on s/sx of and management of hypoglycemia  -Next A1C anticipated 3 months.  Written patient instructions provided.  Total time in face to face counseling 30 minutes.   Follow up PCP Clinic Visit in 3 months.  Appt made with patient.  Kieth Brightly, PharmD, BCPS Clinical Pharmacist, [facility]  II Phone [phone]

## 2020-11-15 ENCOUNTER — Other Ambulatory Visit: Payer: Self-pay | Admitting: Family Medicine

## 2020-11-15 DIAGNOSIS — I10 Essential (primary) hypertension: Secondary | ICD-10-CM

## 2020-11-29 ENCOUNTER — Ambulatory Visit: Payer: BLUE CROSS/BLUE SHIELD | Admitting: Neurology

## 2020-11-29 ENCOUNTER — Encounter: Payer: Self-pay | Admitting: Neurology

## 2020-11-29 VITALS — BP 168/90 | HR 72 | Ht <= 58 in | Wt 130.0 lb

## 2020-11-29 DIAGNOSIS — G4733 Obstructive sleep apnea (adult) (pediatric): Secondary | ICD-10-CM | POA: Insufficient documentation

## 2020-11-29 DIAGNOSIS — R0683 Snoring: Secondary | ICD-10-CM | POA: Diagnosis not present

## 2020-11-29 DIAGNOSIS — E1140 Type 2 diabetes mellitus with diabetic neuropathy, unspecified: Secondary | ICD-10-CM

## 2020-11-29 DIAGNOSIS — R208 Other disturbances of skin sensation: Secondary | ICD-10-CM | POA: Diagnosis not present

## 2020-11-29 DIAGNOSIS — R519 Headache, unspecified: Secondary | ICD-10-CM | POA: Insufficient documentation

## 2020-11-29 NOTE — Patient Instructions (Signed)
Screening for Sleep Apnea  Sleep apnea is a condition in which breathing pauses or becomes shallow during sleep. Sleep apnea screening is a test to determine if you are at risk for sleep apnea. The test is easy and only takes a few minutes. Your health care provider may ask you to have this test in preparation for surgery or as part of a physical exam. What are the symptoms of sleep apnea? Common symptoms of sleep apnea include:  Snoring.  Restless sleep.  Daytime sleepiness.  Pauses in breathing.  Choking during sleep.  Irritability.  Forgetfulness.  Trouble thinking clearly.  Depression.  Personality changes. Most people with sleep apnea are not aware that they have it. Why should I get screened? Getting screened for sleep apnea can help:  Ensure your safety. It is important for your health care providers to know whether or not you have sleep apnea, especially if you are having surgery or have other long-term (chronic) health conditions.  Improve your health and allow you to get a better night's rest. Restful sleep can help you: ? Have more energy. ? Lose weight. ? Improve high blood pressure. ? Improve diabetes management. ? Prevent stroke. ? Prevent car accidents. How is screening done? Screening usually includes being asked a list of questions about your sleep quality. Some questions you may be asked include:  Do you snore?  Is your sleep restless?  Do you have daytime sleepiness?  Has a partner or spouse told you that you stop breathing during sleep?  Have you had trouble concentrating or memory loss? If your screening test is positive, you are at risk for the condition. Further testing may be needed to confirm a diagnosis of sleep apnea. Where to find more information You can find screening tools online or at your health care clinic. For more information about sleep apnea screening and healthy sleep, visit these websites:  Centers for Disease Control and  Prevention: www.cdc.gov/sleep/index.html  American Sleep Apnea Association: www.sleepapnea.org Contact a health care provider if:  You think that you may have sleep apnea. Summary  Sleep apnea screening can help determine if you are at risk for sleep apnea.  It is important for your health care providers to know whether or not you have sleep apnea, especially if you are having surgery or have other chronic health conditions.  You may be asked to take a screening test for sleep apnea in preparation for surgery or as part of a physical exam. This information is not intended to replace advice given to you by your health care provider. Make sure you discuss any questions you have with your health care provider. Document Revised: 10/02/2018 Document Reviewed: 03/28/2017 Elsevier Patient Education  2020 Elsevier Inc. Quality Sleep Information, Adult Quality sleep is important for your mental and physical health. It also improves your quality of life. Quality sleep means you:  Are asleep for most of the time you are in bed.  Fall asleep within 30 minutes.  Wake up no more than once a night.  Are awake for no longer than 20 minutes if you do wake up during the night. Most adults need 7-8 hours of quality sleep each night. How can poor sleep affect me? If you do not get enough quality sleep, you may have:  Mood swings.  Daytime sleepiness.  Confusion.  Decreased reaction time.  Sleep disorders, such as insomnia and sleep apnea.  Difficulty with: ? Solving problems. ? Coping with stress. ? Paying attention. These issues may   affect your performance and productivity at work, school, and at home. Lack of sleep may also put you at higher risk for accidents, suicide, and risky behaviors. If you do not get quality sleep you may also be at higher risk for several health problems, including:  Infections.  Type 2 diabetes.  Heart disease.  High blood  pressure.  Obesity.  Worsening of long-term conditions, like arthritis, kidney disease, depression, Parkinson's disease, and epilepsy. What actions can I take to get more quality sleep?      Stick to a sleep schedule. Go to sleep and wake up at about the same time each day. Do not try to sleep less on weekdays and make up for lost sleep on weekends. This does not work.  Try to get about 30 minutes of exercise on most days. Do not exercise 2-3 hours before going to bed.  Limit naps during the day to 30 minutes or less.  Do not use any products that contain nicotine or tobacco, such as cigarettes or e-cigarettes. If you need help quitting, ask your health care provider.  Do not drink caffeinated beverages for at least 8 hours before going to bed. Coffee, tea, and some sodas contain caffeine.  Do not drink alcohol close to bedtime.  Do not eat large meals close to bedtime.  Do not take naps in the late afternoon.  Try to get at least 30 minutes of sunlight every day. Morning sunlight is best.  Make time to relax before bed. Reading, listening to music, or taking a hot bath promotes quality sleep.  Make your bedroom a place that promotes quality sleep. Keep your bedroom dark, quiet, and at a comfortable room temperature. Make sure your bed is comfortable. Take out sleep distractions like TV, a computer, smartphone, and bright lights.  If you are lying awake in bed for longer than 20 minutes, get up and do a relaxing activity until you feel sleepy.  Work with your health care provider to treat medical conditions that may affect sleeping, such as: ? Nasal obstruction. ? Snoring. ? Sleep apnea and other sleep disorders.  Talk to your health care provider if you think any of your prescription medicines may cause you to have difficulty falling or staying asleep.  If you have sleep problems, talk with a sleep consultant. If you think you have a sleep disorder, talk with your health  care provider about getting evaluated by a specialist. Where to find more information  National Sleep Foundation website: https://sleepfoundation.org  National Heart, Lung, and Blood Institute (NHLBI): www.nhlbi.nih.gov/files/docs/public/sleep/healthy_sleep.pdf  Centers for Disease Control and Prevention (CDC): www.cdc.gov/sleep/index.html Contact a health care provider if you:  Have trouble getting to sleep or staying asleep.  Often wake up very early in the morning and cannot get back to sleep.  Have daytime sleepiness.  Have daytime sleep attacks of suddenly falling asleep and sudden muscle weakness (narcolepsy).  Have a tingling sensation in your legs with a strong urge to move your legs (restless legs syndrome).  Stop breathing briefly during sleep (sleep apnea).  Think you have a sleep disorder or are taking a medicine that is affecting your quality of sleep. Summary  Most adults need 7-8 hours of quality sleep each night.  Getting enough quality sleep is an important part of health and well-being.  Make your bedroom a place that promotes quality sleep and avoid things that may cause you to have poor sleep, such as alcohol, caffeine, smoking, and large meals.  Talk to   your health care provider if you have trouble falling asleep or staying asleep. This information is not intended to replace advice given to you by your health care provider. Make sure you discuss any questions you have with your health care provider. Document Revised: 03/25/2018 Document Reviewed: 03/25/2018 Elsevier Patient Education  2020 Elsevier Inc.  

## 2020-11-29 NOTE — Progress Notes (Signed)
SLEEP MEDICINE CLINIC    Provider:  Melvyn Novas, MD  Primary Care Physician:  Dettinger, Elige Radon, MD [address] [place] [address]     Referring Provider: Dettinger, Elige Radon, Md [address] [place],  [place] [address]          Chief Complaint according to patient   Patient presents with:    . New Patient (Initial Visit)     presents today for concerns of not sleeping well. pt states that she had a SS 5 yrs ago in [place] which confirmed OSA and RLS. she used a CPAP in which she was unable to tolerate at the time.  She can't sleep because  of tingling in her scalp and wakes with migraine,  She is prediabetic. Has had hypoglycemic attacks.       HISTORY OF PRESENT ILLNESS:  Amy Mccarty is a 59 y.o. year old [place] female patient was seen here for a consultation on 11/29/2020 from Dr Museum/gallery exhibitions officer.    Chief concern according to patient :     I have the pleasure of seeing Amy Mccarty today, a right-handed Other or two or more races female with a possible sleep disorder.  She  has a past medical history of GERD (gastroesophageal reflux disease), Hyperlipidemia, and Hypertension.  She had pre eclampsia with her second and last pregnancy, also jaundice after c -section.  Amy Mccarty is being up as 1 of 9 siblings, she states she has a twin brother who recently became insulin-dependent is a diabetic and her Mccarty has diabetes as well.  She is considered prediabetic at this point but sometimes experiences symptoms that may be hypoglycemic in origin.  She reports tingling numbness or pinprick sensations at the scalp and in her feet.  These have sometimes led her to have difficulties to fall asleep or stay asleep and when she wakes up she free frequently has a headache.  She remembers in her use getting tunnel vision and these were followed by severe headaches, but her morning headaches are of a different quality and do not seem to be migrainous there is no associated  photophobia, phonophobia or nausea not even a latent nausea.  They are dull headaches that affect the whole head and not a focal point not sharp not stabbing not related to temporal throbbing.   The patient had the first sleep study in the year 2014  in [place], she was diagnosed with sleep apnea but could not tolerate the CPAP she was given.  She has no has no history of ENT surgeries, neck or head trauma, she never has been a sleep walker.  Her insomnia is a rather new phenomenon may be about the last 2 years.    Family medical /sleep history:  She is unaware of any other family member on CPAP with OSA, insomnia, or sleep walking.    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   Never has used tobacco-ETOH use - rarely,  Caffeine intake in form of Coffee( 1 cup ) Soda( /) Tea ( green) nor energy drinks. Regular exercise in form of gym, but paused since onset of the pandemic. Marland [employer]   Hobbies :minifarm- chicken.       Sleep habits are as follows:  The patient's dinner time is between 6-7 PM.  The  patient goes to bed at 10.30 PM and reads or watches on her tablet- or phone- once but falls asleep,  continues to sleep for 2-3 hours, wakes for several bathroom breaks, the first time at 2 AM. Has hot flushes.   The preferred sleep position is either side or prone-, with the support of 3 pillows. Dreams are reportedly  frequent/vivid.  6.50 AM is the usual rise time. The patient wakes up spontaneously.   She reports not feeling refreshed or restored in AM, with symptoms such as morning headaches, and residual fatigue. Naps are taken infrequently- she avoids being still- , lasting from 10-30 minutes and are more refreshing than nocturnal sleep.    Review of Systems: Out of a complete 14 system review, the patient complains of only the  following symptoms, and all other reviewed systems are negative.:  Fatigue, sleepiness , Husband reported  snoring,  fragmented sleep, Insomnia with hot flushes and with nocturia and dysesthesias.     How likely are you to doze in the following situations: 0 = not likely, 1 = slight chance, 2 = moderate chance, 3 = high chance   Sitting and Reading? Watching Television? Sitting inactive in a public place (theater or meeting)? As a passenger in a car for an hour without a break? Lying down in the afternoon when circumstances permit? Sitting and talking to someone? Sitting quietly after lunch without alcohol? In a car, while stopped for a few minutes in traffic?   Total = 10-12/ 24 points   FSS endorsed at 30/ 63 points.  GDS: no depression, just bored.   Social History   Socioeconomic History  . Marital status: Married    Spouse name: Not on file  . Number of children: Not on file  . Years of education: Not on file  . Highest education level: Not on file  Occupational History  . Not on file  Tobacco Use  . Smoking status: Never Smoker  . Smokeless tobacco: Never Used  Substance and Sexual Activity  . Alcohol use: Yes    Comment: occ  . Drug use: Never  . Sexual activity: Not Currently    Comment: pain with intercourse with husband, will come back for pap  Other Topics Concern  . Not on file  Social History Narrative  . Not on file   Social Determinants of Health   Financial Resource Strain:   . Difficulty of Paying Living Expenses: Not on file  Food Insecurity:   . Worried About Programme researcher, broadcasting/film/videounning Out of Food in the Last Year: Not on file  . Ran Out of Food in the Last Year: Not on file  Transportation Needs:   . Lack of Transportation (Medical): Not on file  . Lack of Transportation (Non-Medical): Not on file  Physical Activity:   . Days of Exercise per Week: Not on file  . Minutes of Exercise per Session: Not on file  Stress:   . Feeling of Stress : Not on file   Social Connections:   . Frequency of Communication with Friends and Family: Not on file  . Frequency of Social Gatherings with Friends and Family: Not on file  . Attends Religious Services: christian - [employer]   . Active Member of Clubs or Organizations: Not on file  . Attends BankerClub or Organization Meetings: Not on file  . Marital Status: married , 2 daughters, 5 grandchildren.     Family History  Problem Relation Age of Onset  . Diabetes Mccarty   .  Hyperlipidemia Mccarty   . Hypertension Mccarty   . Amy Mccarty   . Stroke Father   . Diabetes Brother   . Hyperlipidemia Brother   . Hypertension Brother     Past Medical History:  Diagnosis Date  . GERD (gastroesophageal reflux disease)   . Hyperlipidemia   . Hypertension     Past Surgical History:  Procedure Laterality Date  . CESAREAN SECTION    . COLPOSCOPY       Current Outpatient Medications on File Prior to Visit  Medication Sig Dispense Refill  . folic acid (FOLVITE) 1 MG tablet Take 1 mg by mouth daily.    . pantoprazole (PROTONIX) 40 MG tablet TAKE 1 TABLET(40 MG) BY MOUTH DAILY 90 tablet 3  . pramipexole (MIRAPEX) 0.125 MG tablet Take 1 tablet (0.125 mg total) by mouth at bedtime. 90 tablet 3  . pravastatin (PRAVACHOL) 10 MG tablet Take 1 tablet (10 mg total) by mouth every other day. 90 tablet 3  . valsartan (DIOVAN) 160 MG tablet TAKE 1 TABLET(160 MG) BY MOUTH DAILY 90 tablet 0   No current facility-administered medications on file prior to visit.    Allergies  Allergen Reactions  . Ibuprofen Rash  . Naproxen Rash    Physical exam:  Today's Vitals   11/29/20 1003  BP: (!) 168/90  Pulse: 72  Weight: 130 lb (59 kg)  Height: 4\' 9"  (1.448 m)   Body mass index is 28.13 kg/m.   Wt Readings from Last 3 Encounters:  11/29/20 130 lb (59 kg)  10/16/20 131 lb (59.4 kg)  09/27/20 130 lb (59 kg)     Ht Readings from Last 3 Encounters:  11/29/20 4\' 9"  (1.448 m)  10/16/20 4\' 9"  (1.448 m)   09/27/20 4\' 9"  (1.448 m)      General: The patient is awake, alert and appears not in acute distress. The patient is well groomed. Head: Normocephalic, atraumatic. Neck is supple. Mallampati 2,  neck circumference:14.5  inches . Nasal airflow patent.  Retrognathia is not seen.  Dental status: intact Cardiovascular:  Regular rate and cardiac rhythm by pulse,  without distended neck veins. Respiratory: Lungs are clear to auscultation.  Skin:  Without evidence of ankle edema, or rash. Trunk: The patient's posture is erect.   Neurologic exam : The patient is awake and alert, oriented to place and time.   Memory subjective described as intact.  Attention span & concentration ability appears normal.  Speech is fluent,  without  dysarthria, dysphonia or aphasia.  Mood and affect are appropriate.   Cranial nerves: no loss of smell or taste reported  Pupils are equal and briskly reactive to light. Funduscopic exam deferred..  Extraocular movements in vertical and horizontal planes were intact and without nystagmus. No Diplopia. Visual fields by finger perimetry are intact. Hearing was intact to soft voice and finger rubbing.    Facial sensation intact to fine touch.  Facial motor strength is symmetric and tongue and uvula move midline.  Neck ROM : rotation, tilt and flexion extension were normal for age and shoulder shrug was symmetrical.    Motor exam:  Symmetric bulk, tone and ROM.   Normal tone without cog -wheeling, symmetric grip strength .   Sensory:  Fine touch, pinprick and vibration were felt normal. She reports a bandlike burning sensation on the left side of her leg, dermatome L4-5 , toes not numb to vibration.  Proprioception tested in the upper extremities was normal.   Coordination: Rapid alternating  movements in the fingers/hands were of normal speed.  The Finger-to-nose maneuver was intact without evidence of ataxia, dysmetria or tremor.   Gait and station: Patient could  rise unassisted from a seated position, walked without assistive device.  Stance is of normal width/ base and the patient turned with 3 steps.  Toe and heel walk were deferred.  Deep tendon reflexes: in the  upper and lower extremities are symmetric and intact.  Babinski response was deferred.      After spending a total time of 45 minutes face to face and additional time for physical and neurologic examination, review of laboratory studies,  personal review of imaging studies, reports and results of other testing and review of referral information / records as far as provided in visit, I have established the following assessments:  1) OSA husband witnessed snoring, she has paused in your in her breathing, was positive for apnea 6 years ago near [place], [place].  2) Metabolic syndrome with pre-diabetis and HTN.  3) EDS, morning headaches, hypoventilation or hypoglycemia related.  4) suspect DM related tingling abnormalities.     My Plan is to proceed with:  1) OSA testing , HST and PSG SPLIT are both options.  2) patient requests a small interface, nasal pillow or nasal mask to be tried face.    I would like to thank Dettinger, Elige Radon, Md [address] [place],  [place] [address] for allowing me to meet with and to take care of this pleasant patient.   In short, Sianna Garofano is presenting with untreated OSA,   I plan to follow up either personally or through our NP within 3-4  month.     Electronically signed by: Melvyn Novas, MD 11/29/2020 10:27 AM  [facility] Board certified by The [employer] of Sleep Medicine and Diplomate of the [employer] of Sleep Medicine. Board certified In Neurology through the ABPN, Fellow of the [employer] of Neurology. Medical Director of [facility].

## 2020-12-07 ENCOUNTER — Other Ambulatory Visit: Payer: Self-pay

## 2020-12-07 DIAGNOSIS — Z20822 Contact with and (suspected) exposure to covid-19: Secondary | ICD-10-CM

## 2020-12-09 LAB — SPECIMEN STATUS REPORT

## 2020-12-09 LAB — NOVEL CORONAVIRUS, NAA: SARS-CoV-2, NAA: NOT DETECTED

## 2020-12-09 LAB — SARS-COV-2, NAA 2 DAY TAT

## 2020-12-13 ENCOUNTER — Other Ambulatory Visit: Payer: Self-pay

## 2020-12-13 ENCOUNTER — Ambulatory Visit (INDEPENDENT_AMBULATORY_CARE_PROVIDER_SITE_OTHER): Payer: BLUE CROSS/BLUE SHIELD | Admitting: Neurology

## 2020-12-13 DIAGNOSIS — G4733 Obstructive sleep apnea (adult) (pediatric): Secondary | ICD-10-CM | POA: Diagnosis not present

## 2020-12-13 DIAGNOSIS — R519 Headache, unspecified: Secondary | ICD-10-CM

## 2020-12-13 DIAGNOSIS — E1140 Type 2 diabetes mellitus with diabetic neuropathy, unspecified: Secondary | ICD-10-CM

## 2020-12-13 DIAGNOSIS — R208 Other disturbances of skin sensation: Secondary | ICD-10-CM

## 2020-12-15 DIAGNOSIS — E1140 Type 2 diabetes mellitus with diabetic neuropathy, unspecified: Secondary | ICD-10-CM | POA: Insufficient documentation

## 2020-12-15 NOTE — Progress Notes (Signed)
Severely delayed sleep onset and insomnia.The arousals were noted as: 51 were spontaneous, 0 were associated with PLMs, 3 were associated with respiratory events. The patient had a total of 0 Periodic Limb Movements.   Audio and video analysis did not show any abnormal or unusual movements, behaviors, phonations or vocalizations.   Soft Snoring was noted. EKG was in keeping with normal sinus rhythm (NSR).  IMPRESSION:  1. No evidence of a physiologic-organic sleep disorder such as PLMD, OSA, UARS or cardiac arrhythmias. 2. Snoring was rated as mild- and clustered in REM sleep.    RECOMMENDATIONS:  1. Advise referral to cognitive behavior therapy for insomnia, behavioral induced.  2. The reported morning headaches can be related to insomnia, but Insomnia is not of organic origin.

## 2020-12-15 NOTE — Procedures (Signed)
PATIENT'S NAME:  Amy Mccarty, Amy Mccarty DOB:      Oct 22, 1961      MR#:    [id number]     DATE OF RECORDING: 12/13/2020   MR REFERRING M.D.:  Arville Care, MD Study Performed:   Baseline Polysomnogram HISTORY:  Amy Mccarty, a right-handed 59 year old female, presents with a possible sleep disorder.  She has a past medical history of GERD (gastroesophageal reflux disease), Hyperlipidemia, and Hypertension.  She had pre-eclampsia with her second and last pregnancy, also jaundice after c -section.  Amy Mccarty is considered prediabetic at this point but sometimes experiences symptoms that may be hypoglycemic in origin.  She reports tingling numbness or pinprick sensations at the scalp and in her feet.  These have sometimes led her to have difficulties to fall asleep or stay asleep and when she wakes up she free frequently has a headache.  She remembers in her use getting tunnel vision and these were followed by severe headaches, but her morning headaches are of a different quality and do not seem to be migrainous there is no associated photophobia, phonophobia or nausea not even a latent nausea.  They are dull headaches that affect the whole head and not a focal point not sharp not stabbing not related to temporal throbbing. The patient had the first sleep study in the year 2014 in [place], she was diagnosed with sleep apnea but could not tolerate the CPAP she was given.  Her insomnia is a rather new phenomenon may be about the last 2 years.  The patient endorsed the Epworth Sleepiness Scale at 11 points.   The patient's weight 130 pounds with a height of 57 (inches), resulting in a BMI of 28.1 kg/m2. The patient's neck circumference measured 14.5 inches.  CURRENT MEDICATIONS: Folvite, Protonix, Mirapex, Pravachol, Diovan   PROCEDURE:  This is a multichannel digital polysomnogram utilizing the Somnostar 11.2 system.  Electrodes and sensors were applied and monitored per AASM Specifications.   EEG,  EOG, Chin and Limb EMG, were sampled at 200 Hz.  ECG, Snore and Nasal Pressure, Thermal Airflow, Respiratory Effort, CPAP Flow and Pressure, Oximetry was sampled at 50 Hz. Digital video and audio were recorded.      BASELINE STUDY: Lights Out was at 21:51 and Lights On at 04:56.  Total recording time (TRT) was 425.5 minutes, with a total sleep time (TST) of 195 minutes.  The patient's sleep latency was 200.5 minutes.   REM latency was 244.5 minutes.   The sleep efficiency was poor at only 45.8 %.     SLEEP ARCHITECTURE: WASO (Wake after sleep onset) was 142.5 minutes.  There were 9.5 minutes in Stage N1, 174.5 minutes Stage N2, 4 minutes Stage N3 and 7 minutes in Stage REM.  The percentage of Stage N1 was 4.9%, Stage N2 was 89.5%, Stage N3 was 2.1% and Stage R (REM sleep) was 3.6%.   RESPIRATORY ANALYSIS:  There were a total of 8 respiratory events:  7 obstructive apneas, 0 central apneas and 0 mixed apneas with 1 hypopnea.      The total APNEA/HYPOPNEA INDEX (AHI) was 2.5/hour.  6 events occurred in REM sleep and 0 events in NREM. The REM AHI was  51.4 /hour, versus a non-REM AHI of .6. The patient spent 194 minutes of total sleep time in the supine position and 1 minutes in non-supine. The supine AHI was 2.5 versus a non-supine AHI of 0.0.  OXYGEN SATURATION & C02:  The Wake baseline 02 saturation was  96%, with the lowest being 83%. Time spent below 89% saturation equaled 1 minute.  The arousals were noted as: 51 were spontaneous, 0 were associated with PLMs, 3 were associated with respiratory events. The patient had a total of 0 Periodic Limb Movements.   Audio and video analysis did not show any abnormal or unusual movements, behaviors, phonations or vocalizations.   Soft Snoring was noted. EKG was in keeping with normal sinus rhythm (NSR).  IMPRESSION:  1. No evidence of a physiologic-organic sleep disorder such as PLMD, OSA, UARS or cardiac arrhythmias. 2. Snoring was rated as mild- and  clustered in REM sleep.    RECOMMENDATIONS:  1. Advise referral to cognitive behavior therapy for insomnia, behavioral induced.  2. The reported morning headaches can be related to insomnia, but Insomnia is not of organic origin.    I certify that I have reviewed the entire raw data recording prior to the issuance of this report in accordance with the Standards of Accreditation of the American Academy of Sleep Medicine (AASM)    Melvyn Novas, MD Diplomat, American Board of Psychiatry and Neurology  Diplomat, American Board of Sleep Medicine Wellsite geologist, [facility] [employer]

## 2020-12-18 ENCOUNTER — Telehealth: Payer: Self-pay | Admitting: Neurology

## 2020-12-18 ENCOUNTER — Encounter: Payer: Self-pay | Admitting: Neurology

## 2020-12-18 NOTE — Telephone Encounter (Signed)
-----   Message from Melvyn Novas, MD sent at 12/15/2020  3:34 PM EST ----- Severely delayed sleep onset and insomnia.The arousals were noted as: 51 were spontaneous, 0 were associated with PLMs, 3 were associated with respiratory events. The patient had a total of 0 Periodic Limb Movements.   Audio and video analysis did not show any abnormal or unusual movements, behaviors, phonations or vocalizations.   Soft Snoring was noted. EKG was in keeping with normal sinus rhythm (NSR).  IMPRESSION:  1. No evidence of a physiologic-organic sleep disorder such as PLMD, OSA, UARS or cardiac arrhythmias. 2. Snoring was rated as mild- and clustered in REM sleep.    RECOMMENDATIONS:  1. Advise referral to cognitive behavior therapy for insomnia, behavioral induced.  2. The reported morning headaches can be related to insomnia, but Insomnia is not of organic origin.

## 2020-12-18 NOTE — Telephone Encounter (Signed)
Called patient to discuss sleep study results. No answer at this time. LVM for the patient to call back.  Will send a mychart message as well. 

## 2020-12-26 NOTE — Telephone Encounter (Signed)
Pt returned our call. I discussed her sleep study results and recommendations. Pt will follow up with Dr. Louanne Skye and understands that she may need a CBT-I referral from her PCP to discuss insomnia. Pt verbalized understanding of results. Pt had no questions at this time but was encouraged to call back if questions arise.

## 2021-01-12 ENCOUNTER — Ambulatory Visit: Payer: BLUE CROSS/BLUE SHIELD | Admitting: Family Medicine

## 2021-01-12 ENCOUNTER — Encounter: Payer: Self-pay | Admitting: Family Medicine

## 2021-01-12 ENCOUNTER — Other Ambulatory Visit: Payer: Self-pay

## 2021-01-12 VITALS — BP 145/73 | HR 62 | Ht <= 58 in | Wt 125.0 lb

## 2021-01-12 DIAGNOSIS — I10 Essential (primary) hypertension: Secondary | ICD-10-CM | POA: Diagnosis not present

## 2021-01-12 DIAGNOSIS — E1140 Type 2 diabetes mellitus with diabetic neuropathy, unspecified: Secondary | ICD-10-CM

## 2021-01-12 DIAGNOSIS — E119 Type 2 diabetes mellitus without complications: Secondary | ICD-10-CM | POA: Diagnosis not present

## 2021-01-12 DIAGNOSIS — E785 Hyperlipidemia, unspecified: Secondary | ICD-10-CM

## 2021-01-12 LAB — BAYER DCA HB A1C WAIVED: HB A1C (BAYER DCA - WAIVED): 6.4 % (ref <7.0)

## 2021-01-12 MED ORDER — VALSARTAN 160 MG PO TABS: 160.0000 mg | ORAL_TABLET | Freq: Every day | ORAL | 3 refills | Status: DC

## 2021-01-12 NOTE — Progress Notes (Signed)
BP (!) 145/73   Pulse 62   Ht '4\' 9"'  (1.448 m)   Wt 125 lb (56.7 kg)   SpO2 99%   BMI 27.05 kg/m    Subjective:   Patient ID: Amy Mccarty, female    DOB: 1961-11-03, 60 y.o.   MRN: [id number]  HPI: Amy Mccarty is a 60 y.o. female presenting on 01/12/2021 for Medical Management of Chronic Issues, Diabetes, Hyperlipidemia, and Hypertension   HPI Type 2 diabetes mellitus Patient comes in today for recheck of his diabetes. Patient has been currently taking no medication currently has been diet controlled, A1c is 6.4 which is down from previous.. Patient is currently on an ACE inhibitor/ARB. Patient has seen an ophthalmologist this year. Patient denies any issues with their feet. The symptom started onset as an adult hypertension and hyperlipidemia and neuropathy ARE RELATED TO DM   Hyperlipidemia Patient is coming in for recheck of his hyperlipidemia. The patient is currently taking pravastatin. They deny any issues with myalgias or history of liver damage from it. They deny any focal numbness or weakness or chest pain.   Hypertension Patient is currently on valsartan, and their blood pressure today is 145/73. Patient denies any lightheadedness or dizziness. Patient denies headaches, blurred vision, chest pains, shortness of breath, or weakness. Denies any side effects from medication and is content with current medication.   Relevant past medical, surgical, family and social history reviewed and updated as indicated. Interim medical history since our last visit reviewed. Allergies and medications reviewed and updated.  Review of Systems  Constitutional: Negative for chills and fever.  Eyes: Negative for visual disturbance.  Respiratory: Negative for chest tightness and shortness of breath.   Cardiovascular: Negative for chest pain and leg swelling.  Musculoskeletal: Negative for back pain and gait problem.  Skin: Negative for rash.  Neurological: Positive for numbness.  Negative for light-headedness and headaches.  Psychiatric/Behavioral: Negative for agitation and behavioral problems.  All other systems reviewed and are negative.   Per HPI unless specifically indicated above   Allergies as of 01/12/2021      Reactions   Ibuprofen Rash   Naproxen Rash      Medication List       Accurate as of January 12, 2021  1:26 PM. If you have any questions, ask your nurse or doctor.        folic acid 1 MG tablet Commonly known as: FOLVITE Take 1 mg by mouth daily.   pantoprazole 40 MG tablet Commonly known as: PROTONIX TAKE 1 TABLET(40 MG) BY MOUTH DAILY   pramipexole 0.125 MG tablet Commonly known as: Mirapex Take 1 tablet (0.125 mg total) by mouth at bedtime.   pravastatin 10 MG tablet Commonly known as: PRAVACHOL Take 1 tablet (10 mg total) by mouth every other day.   valsartan 160 MG tablet Commonly known as: DIOVAN TAKE 1 TABLET(160 MG) BY MOUTH DAILY        Objective:   BP (!) 145/73   Pulse 62   Ht '4\' 9"'  (1.448 m)   Wt 125 lb (56.7 kg)   SpO2 99%   BMI 27.05 kg/m   Wt Readings from Last 3 Encounters:  01/12/21 125 lb (56.7 kg)  11/29/20 130 lb (59 kg)  10/16/20 131 lb (59.4 kg)    Physical Exam Vitals and nursing note reviewed.  Constitutional:      General: She is not in acute distress.    Appearance: She is well-developed and well-nourished. She is not  diaphoretic.  Eyes:     Extraocular Movements: EOM normal.     Conjunctiva/sclera: Conjunctivae normal.  Cardiovascular:     Rate and Rhythm: Normal rate and regular rhythm.     Pulses: Intact distal pulses.     Heart sounds: Normal heart sounds. No murmur heard.   Pulmonary:     Effort: Pulmonary effort is normal. No respiratory distress.     Breath sounds: Normal breath sounds. No wheezing.  Musculoskeletal:        General: No tenderness or edema. Normal range of motion.  Skin:    General: Skin is warm and dry.     Findings: No rash.  Neurological:      Mental Status: She is alert and oriented to person, place, and time.     Coordination: Coordination normal.  Psychiatric:        Mood and Affect: Mood and affect normal.        Behavior: Behavior normal.       Assessment & Plan:   Problem List Items Addressed This Visit      Cardiovascular and Mediastinum   Essential hypertension   Relevant Orders   CBC with Differential/Platelet   CMP14+EGFR   Lipid panel   Bayer DCA Hb A1c Waived     Endocrine   Type 2 diabetes mellitus with diabetic neuropathy, without long-term current use of insulin (HCC)     Other   Hyperlipidemia LDL goal <130   Relevant Orders   CBC with Differential/Platelet   CMP14+EGFR   Lipid panel   Bayer DCA Hb A1c Waived    Other Visit Diagnoses    Type 2 diabetes mellitus without complication, without long-term current use of insulin (HCC)    -  Primary   Relevant Orders   CBC with Differential/Platelet   CMP14+EGFR   Lipid panel   Bayer DCA Hb A1c Waived      A1c 6.4, looks good, continue with diet control.  Patient had a sleep study that showed that she did not have obstructive sleep apnea Follow up plan: Return in about 3 months (around 04/12/2021), or if symptoms worsen or fail to improve, for Diabetes and hypertension and cholesterol.  Counseling provided for all of the vaccine components Orders Placed This Encounter  Procedures  . CBC with Differential/Platelet  . CMP14+EGFR  . Lipid panel  . Bayer [facility] Hb A1c Meadow Glade, MD [facility] 01/12/2021, 1:26 PM

## 2021-01-13 LAB — CMP14+EGFR
ALT: 21 IU/L (ref 0–32)
AST: 21 IU/L (ref 0–40)
Albumin/Globulin Ratio: 1.7 (ref 1.2–2.2)
Albumin: 4.3 g/dL (ref 3.8–4.9)
Alkaline Phosphatase: 89 IU/L (ref 44–121)
BUN/Creatinine Ratio: 13 (ref 9–23)
BUN: 12 mg/dL (ref 6–24)
Bilirubin Total: 0.3 mg/dL (ref 0.0–1.2)
CO2: 27 mmol/L (ref 20–29)
Calcium: 9.6 mg/dL (ref 8.7–10.2)
Chloride: 103 mmol/L (ref 96–106)
Creatinine, Ser: 0.89 mg/dL (ref 0.57–1.00)
GFR calc Af Amer: 83 mL/min/{1.73_m2} (ref >59)
GFR calc non Af Amer: 72 mL/min/{1.73_m2} (ref >59)
Globulin, Total: 2.5 g/dL (ref 1.5–4.5)
Glucose: 96 mg/dL (ref 65–99)
Potassium: 4.4 mmol/L (ref 3.5–5.2)
Sodium: 141 mmol/L (ref 134–144)
Total Protein: 6.8 g/dL (ref 6.0–8.5)

## 2021-01-13 LAB — CBC WITH DIFFERENTIAL/PLATELET
Basophils Absolute: 0 10*3/uL (ref 0.0–0.2)
Basos: 0 %
EOS (ABSOLUTE): 0.1 10*3/uL (ref 0.0–0.4)
Eos: 2 %
Hematocrit: 40.4 % (ref 34.0–46.6)
Hemoglobin: 13.4 g/dL (ref 11.1–15.9)
Immature Grans (Abs): 0 10*3/uL (ref 0.0–0.1)
Immature Granulocytes: 0 %
Lymphocytes Absolute: 1.7 10*3/uL (ref 0.7–3.1)
Lymphs: 30 %
MCH: 28.7 pg (ref 26.6–33.0)
MCHC: 33.2 g/dL (ref 31.5–35.7)
MCV: 87 fL (ref 79–97)
Monocytes Absolute: 0.4 10*3/uL (ref 0.1–0.9)
Monocytes: 7 %
Neutrophils Absolute: 3.5 10*3/uL (ref 1.4–7.0)
Neutrophils: 61 %
Platelets: 243 10*3/uL (ref 150–450)
RBC: 4.67 x10E6/uL (ref 3.77–5.28)
RDW: 13 % (ref 11.7–15.4)
WBC: 5.8 10*3/uL (ref 3.4–10.8)

## 2021-01-13 LAB — LIPID PANEL
Chol/HDL Ratio: 2.8 ratio (ref 0.0–4.4)
Cholesterol, Total: 202 mg/dL — ABNORMAL HIGH (ref 100–199)
HDL: 71 mg/dL (ref >39)
LDL Chol Calc (NIH): 114 mg/dL — ABNORMAL HIGH (ref 0–99)
Triglycerides: 95 mg/dL (ref 0–149)
VLDL Cholesterol Cal: 17 mg/dL (ref 5–40)

## 2021-01-15 ENCOUNTER — Other Ambulatory Visit: Payer: Self-pay | Admitting: *Deleted

## 2021-01-15 MED ORDER — PRAVASTATIN SODIUM 20 MG PO TABS: 20.0000 mg | ORAL_TABLET | Freq: Every day | ORAL | 3 refills | Status: DC

## 2021-01-23 ENCOUNTER — Other Ambulatory Visit: Payer: BLUE CROSS/BLUE SHIELD

## 2021-01-23 DIAGNOSIS — Z20822 Contact with and (suspected) exposure to covid-19: Secondary | ICD-10-CM

## 2021-01-24 LAB — SARS-COV-2, NAA 2 DAY TAT

## 2021-01-24 LAB — NOVEL CORONAVIRUS, NAA: SARS-CoV-2, NAA: NOT DETECTED

## 2021-01-25 ENCOUNTER — Encounter: Payer: Self-pay | Admitting: Family Medicine

## 2021-02-21 DIAGNOSIS — Z01419 Encounter for gynecological examination (general) (routine) without abnormal findings: Secondary | ICD-10-CM | POA: Diagnosis not present

## 2021-02-21 DIAGNOSIS — R87611 Atypical squamous cells cannot exclude high grade squamous intraepithelial lesion on cytologic smear of cervix (ASC-H): Secondary | ICD-10-CM | POA: Diagnosis not present

## 2021-02-21 DIAGNOSIS — Z6827 Body mass index (BMI) 27.0-27.9, adult: Secondary | ICD-10-CM | POA: Diagnosis not present

## 2021-03-01 DIAGNOSIS — Z1231 Encounter for screening mammogram for malignant neoplasm of breast: Secondary | ICD-10-CM | POA: Diagnosis not present

## 2021-03-13 ENCOUNTER — Other Ambulatory Visit: Payer: Self-pay | Admitting: Obstetrics & Gynecology

## 2021-03-13 DIAGNOSIS — R928 Other abnormal and inconclusive findings on diagnostic imaging of breast: Secondary | ICD-10-CM

## 2021-04-04 ENCOUNTER — Ambulatory Visit
Admission: RE | Admit: 2021-04-04 | Discharge: 2021-04-04 | Disposition: A | Discharge status: Home / Self Care | Payer: BLUE CROSS/BLUE SHIELD | Source: Ambulatory Visit | Attending: Obstetrics & Gynecology | Admitting: Obstetrics & Gynecology

## 2021-04-04 ENCOUNTER — Other Ambulatory Visit: Payer: Self-pay

## 2021-04-04 DIAGNOSIS — R928 Other abnormal and inconclusive findings on diagnostic imaging of breast: Secondary | ICD-10-CM

## 2021-04-04 DIAGNOSIS — R922 Inconclusive mammogram: Secondary | ICD-10-CM | POA: Diagnosis not present

## 2021-04-04 DIAGNOSIS — N6489 Other specified disorders of breast: Secondary | ICD-10-CM | POA: Diagnosis not present

## 2021-04-04 IMAGING — US US BREAST*R* LIMITED INC AXILLA
1 series · 2 of 2 positions shown · non-contrast
Comparison: Prior outside studies

CLINICAL DATA: 59-year-old female for further evaluation of
possible RIGHT breast mass on screening mammogram.

EXAM:
DIGITAL DIAGNOSTIC UNILATERAL RIGHT MAMMOGRAM WITH TOMOSYNTHESIS AND
CAD; ULTRASOUND RIGHT BREAST LIMITED
TECHNIQUE: Right digital diagnostic mammography and breast tomosynthesis was
performed. The images were evaluated with computer-aided detection.;
Targeted ultrasound examination of the right breast was performed

[Series 1: us breast*right* limited inc axilla · 0.06mm/px · 2 of 2 slices shown]
[im 1/2]
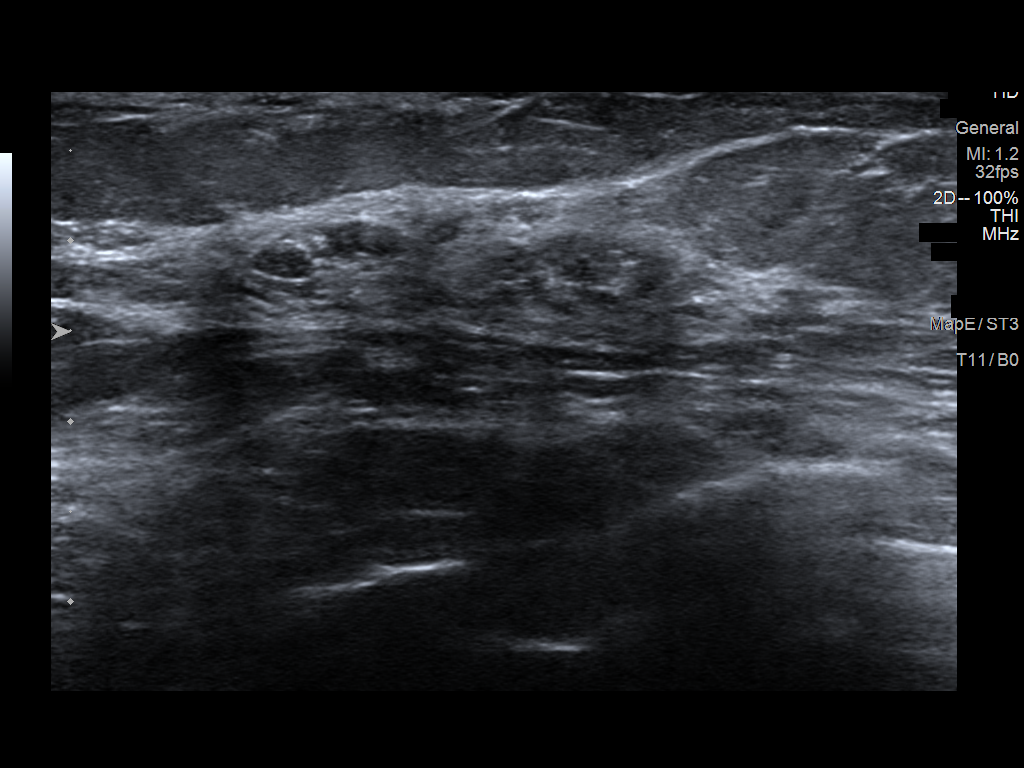
[im 2/2]
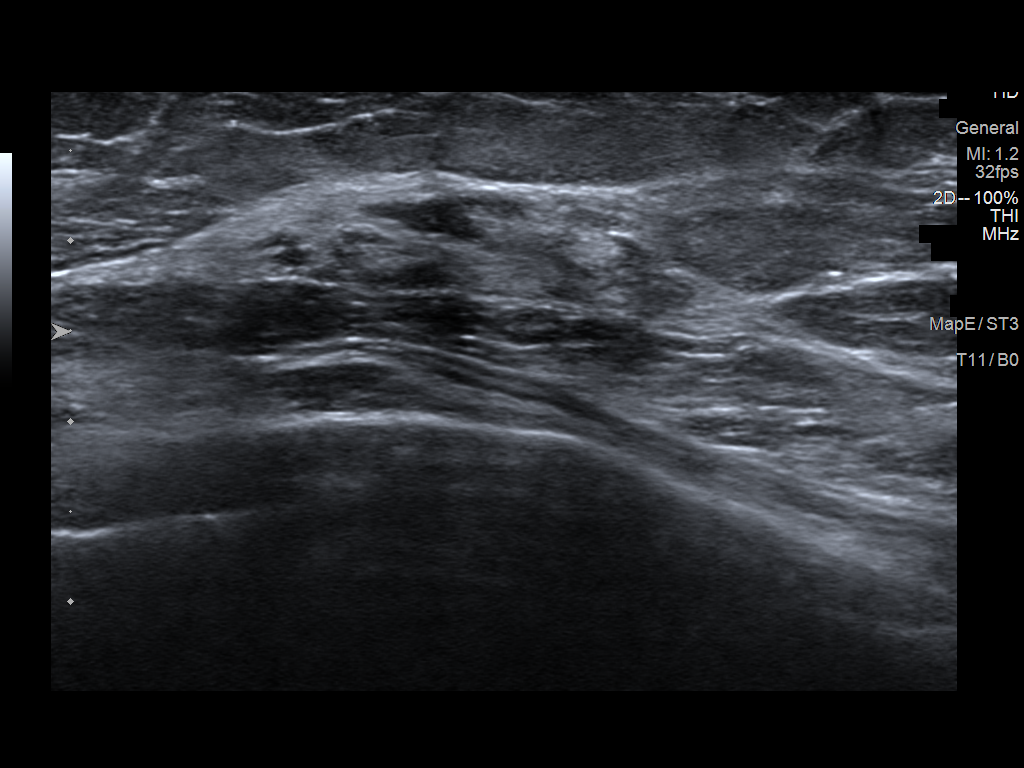

[2 of 2 positions shown; findings below may reference images not displayed]

ACR Breast Density Category b: There are scattered areas of
fibroglandular density.
FINDINGS: 2D/3D spot compression views of the RIGHT breast demonstrate a
persistent oval asymmetry with interspersed fat within the UPPER
OUTER RIGHT breast, unchanged from [DATE].

Targeted ultrasound is performed, showing an island of normal
fibroglandular tissue with internal fat at the 10 o'clock position
of the RIGHT breast 6 cm from the nipple, unchanged from prior
studies.
IMPRESSION: 1. Stable island of normal fibroglandular tissue within the
UPPER-OUTER RIGHT breast corresponding to the screening study
finding. No further imaging follow-up recommended.

RECOMMENDATION:
Bilateral screening mammogram in 1 year.

I have discussed the findings and recommendations with the patient.
If applicable, a reminder letter will be sent to the patient
regarding the next appointment.

BI-RADS CATEGORY  2: Benign.

## 2021-04-16 ENCOUNTER — Encounter: Payer: Self-pay | Admitting: Family Medicine

## 2021-04-16 ENCOUNTER — Ambulatory Visit: Payer: BLUE CROSS/BLUE SHIELD | Admitting: Family Medicine

## 2021-04-16 ENCOUNTER — Other Ambulatory Visit: Payer: Self-pay

## 2021-04-16 VITALS — BP 128/77 | HR 64 | Ht <= 58 in | Wt 125.0 lb

## 2021-04-16 DIAGNOSIS — E1140 Type 2 diabetes mellitus with diabetic neuropathy, unspecified: Secondary | ICD-10-CM

## 2021-04-16 DIAGNOSIS — E785 Hyperlipidemia, unspecified: Secondary | ICD-10-CM | POA: Diagnosis not present

## 2021-04-16 DIAGNOSIS — R102 Pelvic and perineal pain: Secondary | ICD-10-CM

## 2021-04-16 DIAGNOSIS — I10 Essential (primary) hypertension: Secondary | ICD-10-CM

## 2021-04-16 LAB — MICROSCOPIC EXAMINATION
Bacteria, UA: NONE SEEN
Epithelial Cells (non renal): NONE SEEN /hpf (ref 0–10)
RBC, Urine: NONE SEEN /hpf (ref 0–2)
WBC, UA: NONE SEEN /hpf (ref 0–5)

## 2021-04-16 LAB — URINALYSIS, COMPLETE
Bilirubin, UA: NEGATIVE
Glucose, UA: NEGATIVE
Ketones, UA: NEGATIVE
Leukocytes,UA: NEGATIVE
Nitrite, UA: NEGATIVE
Protein,UA: NEGATIVE
RBC, UA: NEGATIVE
Specific Gravity, UA: 1.025 (ref 1.005–1.030)
Urobilinogen, Ur: 0.2 mg/dL (ref 0.2–1.0)
pH, UA: 5 (ref 5.0–7.5)

## 2021-04-16 LAB — BAYER DCA HB A1C WAIVED: HB A1C (BAYER DCA - WAIVED): 6 % (ref <7.0)

## 2021-04-16 NOTE — Progress Notes (Signed)
BP 128/77   Pulse 64   Ht 4\' 9"  (1.448 m)   Wt 125 lb (56.7 kg)   SpO2 99%   BMI 27.05 kg/m    Subjective:   Patient ID: , female    DOB: 04/26/1961, 60 y.o.   MRN: [id number]  HPI: Amy Mccarty is a 60 y.o. female presenting on 04/16/2021 for Medical Management of Chronic Issues, Hyperlipidemia, Hypertension, and Diabetes   HPI Type 2 diabetes mellitus Patient comes in today for recheck of his diabetes. Patient has been currently taking no medication, has been diet controlled. Patient is currently on an ACE inhibitor/ARB. Patient has not seen an ophthalmologist this year. Patient denies any issues with their feet. The symptom started onset as an adult hypertension and hyperlipidemia ARE RELATED TO DM   Hypertension Patient is currently on valsartan, and their blood pressure today is 128/77. Patient denies any lightheadedness or dizziness. Patient denies headaches, blurred vision, chest pains, shortness of breath, or weakness. Denies any side effects from medication and is content with current medication.   Hyperlipidemia Patient is coming in for recheck of his hyperlipidemia. The patient is currently taking pravastatin. They deny any issues with myalgias or history of liver damage from it. They deny any focal numbness or weakness or chest pain.   Patient is complaining of intermittent pelvic discomfort and wants to have a urinalysis.  She says is been going on for 2 days and denies any dysuria or hematuria or frequency or bowel issues or vaginal issues.  Patient is but not having any more frequency but it just hurts on the left lower side of her abdomen/pelvis.  Relevant past medical, surgical, family and social history reviewed and updated as indicated. Interim medical history since our last visit reviewed. Allergies and medications reviewed and updated.  Review of Systems  Constitutional: Negative for chills and fever.  HENT: Negative for congestion, ear  discharge and ear pain.   Eyes: Negative for redness and visual disturbance.  Respiratory: Negative for chest tightness and shortness of breath.   Cardiovascular: Negative for chest pain and leg swelling.  Gastrointestinal: Positive for abdominal pain.  Genitourinary: Negative for difficulty urinating, dysuria, frequency, hematuria, urgency, vaginal bleeding, vaginal discharge and vaginal pain.  Musculoskeletal: Negative for back pain and gait problem.  Skin: Negative for rash.  Neurological: Negative for light-headedness and headaches.  Psychiatric/Behavioral: Negative for agitation and behavioral problems.  All other systems reviewed and are negative.   Per HPI unless specifically indicated above   Allergies as of 04/16/2021      Reactions   Ibuprofen Rash   Naproxen Rash      Medication List       Accurate as of April 16, 2021  1:10 PM. If you have any questions, ask your nurse or doctor.        folic acid 1 MG tablet Commonly known as: FOLVITE Take 1 mg by mouth daily.   pantoprazole 40 MG tablet Commonly known as: PROTONIX TAKE 1 TABLET(40 MG) BY MOUTH DAILY   pramipexole 0.125 MG tablet Commonly known as: Mirapex Take 1 tablet (0.125 mg total) by mouth at bedtime.   pravastatin 20 MG tablet Commonly known as: PRAVACHOL Take 1 tablet (20 mg total) by mouth daily.   valsartan 160 MG tablet Commonly known as: DIOVAN Take 1 tablet (160 mg total) by mouth daily.        Objective:   Ht 4\' 9"  (1.448 m)   Wt 125 lb (56.7  kg)   BMI 27.05 kg/m   Wt Readings from Last 3 Encounters:  04/16/21 125 lb (56.7 kg)  01/12/21 125 lb (56.7 kg)  11/29/20 130 lb (59 kg)    Physical Exam Vitals and nursing note reviewed.  Constitutional:      General: She is not in acute distress.    Appearance: She is well-developed. She is not diaphoretic.  Eyes:     Conjunctiva/sclera: Conjunctivae normal.  Cardiovascular:     Rate and Rhythm: Normal rate and regular rhythm.      Heart sounds: Normal heart sounds. No murmur heard.   Pulmonary:     Effort: Pulmonary effort is normal. No respiratory distress.     Breath sounds: Normal breath sounds. No wheezing.  Abdominal:     General: Abdomen is flat. There is no distension.     Palpations: Abdomen is soft.     Tenderness: There is abdominal tenderness in the suprapubic area and left lower quadrant. There is no right CVA tenderness, left CVA tenderness, guarding or rebound.  Musculoskeletal:        General: No tenderness. Normal range of motion.  Skin:    General: Skin is warm and dry.     Findings: No rash.  Neurological:     Mental Status: She is alert and oriented to person, place, and time.     Coordination: Coordination normal.  Psychiatric:        Behavior: Behavior normal.       Assessment & Plan:   Problem List Items Addressed This Visit      Cardiovascular and Mediastinum   Essential hypertension     Endocrine   Type 2 diabetes mellitus with diabetic neuropathy, without long-term current use of insulin (HCC) - Primary   Relevant Orders   Bayer DCA Hb A1c Waived     Other   Hyperlipidemia LDL goal <130    Other Visit Diagnoses    Pelvic pain       Relevant Orders   Urine Culture   Urinalysis, Complete      Will do urine for patient.  Check A1c follow-up in 3 months Follow up plan: Return in about 3 months (around 07/16/2021), or if symptoms worsen or fail to improve, for Diabetes hypertension.  Counseling provided for all of the vaccine components No orders of the defined types were placed in this encounter.   Arville Care, MD [facility] 04/16/2021, 1:10 PM

## 2021-04-18 LAB — URINE CULTURE

## 2021-05-23 DIAGNOSIS — H04123 Dry eye syndrome of bilateral lacrimal glands: Secondary | ICD-10-CM | POA: Diagnosis not present

## 2021-05-23 DIAGNOSIS — H40033 Anatomical narrow angle, bilateral: Secondary | ICD-10-CM | POA: Diagnosis not present

## 2021-07-19 ENCOUNTER — Other Ambulatory Visit: Payer: Self-pay

## 2021-07-19 ENCOUNTER — Encounter: Payer: Self-pay | Admitting: Family Medicine

## 2021-07-19 ENCOUNTER — Ambulatory Visit: Payer: BLUE CROSS/BLUE SHIELD | Admitting: Family Medicine

## 2021-07-19 VITALS — BP 149/80 | HR 73 | Ht <= 58 in | Wt 123.0 lb

## 2021-07-19 DIAGNOSIS — K219 Gastro-esophageal reflux disease without esophagitis: Secondary | ICD-10-CM

## 2021-07-19 DIAGNOSIS — E1140 Type 2 diabetes mellitus with diabetic neuropathy, unspecified: Secondary | ICD-10-CM | POA: Diagnosis not present

## 2021-07-19 DIAGNOSIS — I10 Essential (primary) hypertension: Secondary | ICD-10-CM | POA: Diagnosis not present

## 2021-07-19 DIAGNOSIS — E785 Hyperlipidemia, unspecified: Secondary | ICD-10-CM | POA: Diagnosis not present

## 2021-07-19 LAB — BAYER DCA HB A1C WAIVED: HB A1C (BAYER DCA - WAIVED): 6.4 % (ref <7.0)

## 2021-07-19 MED ORDER — AMLODIPINE BESYLATE 5 MG PO TABS: 5.0000 mg | ORAL_TABLET | Freq: Every day | ORAL | 3 refills | Status: DC

## 2021-07-19 MED ORDER — PANTOPRAZOLE SODIUM 40 MG PO TBEC: DELAYED_RELEASE_TABLET | ORAL | 3 refills | Status: DC

## 2021-07-19 NOTE — Progress Notes (Signed)
BP (!) 149/80   Pulse 73   Ht '4\' 9"'  (1.448 m)   Wt 123 lb (55.8 kg)   SpO2 98%   BMI 26.62 kg/m    Subjective:   Patient ID: Amy Mccarty, female    DOB: [birth year], 60 y.o.   MRN: [id number]  HPI: Amy Mccarty is a 60 y.o. female presenting on 07/19/2021 for Medical Management of Chronic Issues, Hyperlipidemia, Hypertension, and Diabetes   HPI Type 2 diabetes mellitus Patient comes in today for recheck of his diabetes. Patient has been currently taking no medication has been diet controlled, A1c 6.4. Patient is currently on an ACE inhibitor/ARB. Patient has not seen an ophthalmologist this year. Patient denies any issues with their feet. The symptom started onset as an adult hypertension and hyperlipidemia ARE RELATED TO DM   Hypertension Patient is currently on valsartan, and their blood pressure today is 149/80. Patient denies any lightheadedness or dizziness. Patient denies headaches, blurred vision, chest pains, shortness of breath, or weakness. Denies any side effects from medication and is content with current medication.   Hyperlipidemia Patient is coming in for recheck of his hyperlipidemia. The patient is currently taking pravastatin. They deny any issues with myalgias or history of liver damage from it. They deny any focal numbness or weakness or chest pain.   Relevant past medical, surgical, family and social history reviewed and updated as indicated. Interim medical history since our last visit reviewed. Allergies and medications reviewed and updated.  Review of Systems  Constitutional:  Negative for chills and fever.  Eyes:  Negative for visual disturbance.  Respiratory:  Negative for chest tightness and shortness of breath.   Cardiovascular:  Negative for chest pain and leg swelling.  Musculoskeletal:  Negative for back pain and gait problem.  Skin:  Negative for rash.  Neurological:  Positive for headaches. Negative for dizziness and light-headedness.   Psychiatric/Behavioral:  Negative for agitation and behavioral problems.   All other systems reviewed and are negative.  Per HPI unless specifically indicated above   Allergies as of 07/19/2021       Reactions   Ibuprofen Rash   Naproxen Rash        Medication List        Accurate as of July 19, 2021  1:08 PM. If you have any questions, ask your nurse or doctor.          folic acid 1 MG tablet Commonly known as: FOLVITE Take 1 mg by mouth daily.   pantoprazole 40 MG tablet Commonly known as: PROTONIX TAKE 1 TABLET(40 MG) BY MOUTH DAILY   pramipexole 0.125 MG tablet Commonly known as: Mirapex Take 1 tablet (0.125 mg total) by mouth at bedtime.   pravastatin 20 MG tablet Commonly known as: PRAVACHOL Take 1 tablet (20 mg total) by mouth daily.   valsartan 160 MG tablet Commonly known as: DIOVAN Take 1 tablet (160 mg total) by mouth daily.         Objective:   Pulse 73   Ht '4\' 9"'  (1.448 m)   Wt 123 lb (55.8 kg)   SpO2 98%   BMI 26.62 kg/m   Wt Readings from Last 3 Encounters:  07/19/21 123 lb (55.8 kg)  04/16/21 125 lb (56.7 kg)  01/12/21 125 lb (56.7 kg)    Physical Exam Vitals and nursing note reviewed.  Constitutional:      General: She is not in acute distress.    Appearance: She is well-developed. She is not  diaphoretic.  Eyes:     Conjunctiva/sclera: Conjunctivae normal.  Cardiovascular:     Rate and Rhythm: Normal rate and regular rhythm.     Heart sounds: Normal heart sounds. No murmur heard. Pulmonary:     Effort: Pulmonary effort is normal. No respiratory distress.     Breath sounds: Normal breath sounds. No wheezing.  Musculoskeletal:        General: No tenderness. Normal range of motion.  Skin:    General: Skin is warm and dry.     Findings: No rash.  Neurological:     Mental Status: She is alert and oriented to person, place, and time.     Coordination: Coordination normal.  Psychiatric:        Behavior: Behavior normal.       Assessment & Plan:   Problem List Items Addressed This Visit       Cardiovascular and Mediastinum   Essential hypertension   Relevant Medications   amLODipine (NORVASC) 5 MG tablet   Other Relevant Orders   CBC with Differential/Platelet   CMP14+EGFR   Lipid panel   Bayer DCA Hb A1c Waived     Digestive   GERD (gastroesophageal reflux disease)   Relevant Medications   pantoprazole (PROTONIX) 40 MG tablet     Endocrine   Type 2 diabetes mellitus with diabetic neuropathy, without long-term current use of insulin (HCC) - Primary   Relevant Orders   CBC with Differential/Platelet   CMP14+EGFR   Lipid panel   Bayer DCA Hb A1c Waived     Other   Hyperlipidemia LDL goal <130   Relevant Medications   amLODipine (NORVASC) 5 MG tablet   Other Relevant Orders   CBC with Differential/Platelet   CMP14+EGFR   Lipid panel   Bayer DCA Hb A1c Waived    A1c looks good at 6.4, continue without, will add amlodipine 5 mg to her regimen to help more with her blood pressure.  No other change in medication Follow up plan: Return in about 3 months (around 10/19/2021), or if symptoms worsen or fail to improve, for Diabetes and hypertension and cholesterol.  Counseling provided for all of the vaccine components Orders Placed This Encounter  Procedures   CBC with Differential/Platelet   CMP14+EGFR   Lipid panel   Bayer DCA Hb A1c [place], MD [facility] 07/19/2021, 1:08 PM

## 2021-07-20 LAB — CBC WITH DIFFERENTIAL/PLATELET
Basophils Absolute: 0 10*3/uL (ref 0.0–0.2)
Basos: 1 %
EOS (ABSOLUTE): 0.2 10*3/uL (ref 0.0–0.4)
Eos: 4 %
Hematocrit: 42.3 % (ref 34.0–46.6)
Hemoglobin: 13.6 g/dL (ref 11.1–15.9)
Immature Grans (Abs): 0 10*3/uL (ref 0.0–0.1)
Immature Granulocytes: 1 %
Lymphocytes Absolute: 1.7 10*3/uL (ref 0.7–3.1)
Lymphs: 27 %
MCH: 28.4 pg (ref 26.6–33.0)
MCHC: 32.2 g/dL (ref 31.5–35.7)
MCV: 88 fL (ref 79–97)
Monocytes Absolute: 0.4 10*3/uL (ref 0.1–0.9)
Monocytes: 6 %
Neutrophils Absolute: 3.9 10*3/uL (ref 1.4–7.0)
Neutrophils: 61 %
Platelets: 230 10*3/uL (ref 150–450)
RBC: 4.79 x10E6/uL (ref 3.77–5.28)
RDW: 13.2 % (ref 11.7–15.4)
WBC: 6.2 10*3/uL (ref 3.4–10.8)

## 2021-07-20 LAB — LIPID PANEL
Chol/HDL Ratio: 2.4 ratio (ref 0.0–4.4)
Cholesterol, Total: 179 mg/dL (ref 100–199)
HDL: 75 mg/dL (ref >39)
LDL Chol Calc (NIH): 87 mg/dL (ref 0–99)
Triglycerides: 93 mg/dL (ref 0–149)
VLDL Cholesterol Cal: 17 mg/dL (ref 5–40)

## 2021-07-20 LAB — CMP14+EGFR
ALT: 16 IU/L (ref 0–32)
AST: 15 IU/L (ref 0–40)
Albumin/Globulin Ratio: 1.6 (ref 1.2–2.2)
Albumin: 4.4 g/dL (ref 3.8–4.9)
Alkaline Phosphatase: 95 IU/L (ref 44–121)
BUN/Creatinine Ratio: 16 (ref 9–23)
BUN: 12 mg/dL (ref 6–24)
Bilirubin Total: 0.3 mg/dL (ref 0.0–1.2)
CO2: 27 mmol/L (ref 20–29)
Calcium: 9.6 mg/dL (ref 8.7–10.2)
Chloride: 103 mmol/L (ref 96–106)
Creatinine, Ser: 0.74 mg/dL (ref 0.57–1.00)
Globulin, Total: 2.7 g/dL (ref 1.5–4.5)
Glucose: 105 mg/dL — ABNORMAL HIGH (ref 65–99)
Potassium: 4.1 mmol/L (ref 3.5–5.2)
Sodium: 142 mmol/L (ref 134–144)
Total Protein: 7.1 g/dL (ref 6.0–8.5)
eGFR: 94 mL/min/{1.73_m2} (ref >59)

## 2021-10-01 ENCOUNTER — Other Ambulatory Visit: Payer: Self-pay | Admitting: Family Medicine

## 2021-10-01 DIAGNOSIS — K219 Gastro-esophageal reflux disease without esophagitis: Secondary | ICD-10-CM

## 2021-10-19 ENCOUNTER — Ambulatory Visit: Payer: BLUE CROSS/BLUE SHIELD | Admitting: Family Medicine

## 2021-10-19 ENCOUNTER — Encounter: Payer: Self-pay | Admitting: Family Medicine

## 2021-10-19 ENCOUNTER — Other Ambulatory Visit: Payer: Self-pay

## 2021-10-19 VITALS — BP 132/80 | HR 60 | Ht <= 58 in | Wt 124.0 lb

## 2021-10-19 DIAGNOSIS — K219 Gastro-esophageal reflux disease without esophagitis: Secondary | ICD-10-CM

## 2021-10-19 DIAGNOSIS — E1140 Type 2 diabetes mellitus with diabetic neuropathy, unspecified: Secondary | ICD-10-CM | POA: Diagnosis not present

## 2021-10-19 DIAGNOSIS — E785 Hyperlipidemia, unspecified: Secondary | ICD-10-CM

## 2021-10-19 DIAGNOSIS — I10 Essential (primary) hypertension: Secondary | ICD-10-CM

## 2021-10-19 LAB — BAYER DCA HB A1C WAIVED: HB A1C (BAYER DCA - WAIVED): 6.2 % — ABNORMAL HIGH (ref 4.8–5.6)

## 2021-10-19 MED ORDER — PANTOPRAZOLE SODIUM 40 MG PO TBEC: 40.0000 mg | DELAYED_RELEASE_TABLET | Freq: Every day | ORAL | 3 refills | Status: DC

## 2021-10-19 MED ORDER — PRAMIPEXOLE DIHYDROCHLORIDE 0.125 MG PO TABS: 0.1250 mg | ORAL_TABLET | Freq: Every day | ORAL | 3 refills | Status: DC

## 2021-10-19 NOTE — Progress Notes (Signed)
BP 132/80   Pulse 60   Ht 4\' 9"  (1.448 m)   Wt 124 lb (56.2 kg)   SpO2 98%   BMI 26.83 kg/m    Subjective:   Patient ID: , female    DOB: 1961/02/02, 60 y.o.   MRN: [id number]  HPI: Amy Mccarty is a 60 y.o. female presenting on 10/19/2021 for Medical Management of Chronic Issues, Diabetes, Hyperlipidemia, Hypertension, and Gastroesophageal Reflux   HPI Type 2 diabetes mellitus Patient comes in today for recheck of his diabetes. Patient has been currently taking no medication and has been diet controlled, A1c looks good. Patient is currently on an ACE inhibitor/ARB. Patient has not seen an ophthalmologist this year. Patient denies any issues with their feet. The symptom started onset as an adult hypertension and hyperlipidemia ARE RELATED TO DM   Hypertension Patient is currently on valsartan and amlodipine, and their blood pressure today is 130/80. Patient denies any lightheadedness or dizziness. Patient denies headaches, blurred vision, chest pains, shortness of breath, or weakness. Denies any side effects from medication and is content with current medication.   Hyperlipidemia Patient is coming in for recheck of his hyperlipidemia. The patient is currently taking pravastatin. They deny any issues with myalgias or history of liver damage from it. They deny any focal numbness or weakness or chest pain.   GERD Patient is currently on pantoprazole.  She denies any major symptoms or abdominal pain or belching or burping. She denies any blood in her stool or lightheadedness or dizziness.   Relevant past medical, surgical, family and social history reviewed and updated as indicated. Interim medical history since our last visit reviewed. Allergies and medications reviewed and updated.  Review of Systems  Constitutional:  Negative for chills and fever.  Eyes:  Negative for visual disturbance.  Respiratory:  Negative for chest tightness and shortness of breath.    Cardiovascular:  Negative for chest pain and leg swelling.  Musculoskeletal:  Negative for back pain and gait problem.  Skin:  Negative for rash.  Neurological:  Negative for light-headedness and headaches.  Psychiatric/Behavioral:  Negative for agitation and behavioral problems.   All other systems reviewed and are negative.  Per HPI unless specifically indicated above   Allergies as of 10/19/2021       Reactions   Ibuprofen Rash   Naproxen Rash        Medication List        Accurate as of October 19, 2021 10:16 AM. If you have any questions, ask your nurse or doctor.          STOP taking these medications    pramipexole 0.125 MG tablet Commonly known as: Mirapex Stopped by: October 21, 2021 Adely Facer, MD       TAKE these medications    amLODipine 5 MG tablet Commonly known as: NORVASC Take 1 tablet (5 mg total) by mouth daily.   folic acid 1 MG tablet Commonly known as: FOLVITE Take 1 mg by mouth daily.   pantoprazole 40 MG tablet Commonly known as: PROTONIX Take 1 tablet (40 mg total) by mouth daily. What changed: See the new instructions. Changed by: Elige Radon Teri Diltz, MD   pravastatin 20 MG tablet Commonly known as: PRAVACHOL Take 1 tablet (20 mg total) by mouth daily.   valsartan 160 MG tablet Commonly known as: DIOVAN Take 1 tablet (160 mg total) by mouth daily.         Objective:   BP 132/80   Pulse 60  Ht 4\' 9"  (1.448 m)   Wt 124 lb (56.2 kg)   SpO2 98%   BMI 26.83 kg/m   Wt Readings from Last 3 Encounters:  10/19/21 124 lb (56.2 kg)  07/19/21 123 lb (55.8 kg)  04/16/21 125 lb (56.7 kg)    Physical Exam Vitals and nursing note reviewed.  Constitutional:      General: She is not in acute distress.    Appearance: She is well-developed. She is not diaphoretic.  Eyes:     Conjunctiva/sclera: Conjunctivae normal.  Cardiovascular:     Rate and Rhythm: Normal rate and regular rhythm.     Heart sounds: Normal heart sounds. No  murmur heard. Pulmonary:     Effort: Pulmonary effort is normal. No respiratory distress.     Breath sounds: Normal breath sounds. No wheezing.  Musculoskeletal:        General: No swelling or tenderness. Normal range of motion.  Skin:    General: Skin is warm and dry.     Findings: No rash.  Neurological:     Mental Status: She is alert and oriented to person, place, and time.     Coordination: Coordination normal.  Psychiatric:        Behavior: Behavior normal.      Assessment & Plan:   Problem List Items Addressed This Visit       Cardiovascular and Mediastinum   Essential hypertension     Digestive   GERD (gastroesophageal reflux disease)   Relevant Medications   pantoprazole (PROTONIX) 40 MG tablet     Endocrine   Type 2 diabetes mellitus with diabetic neuropathy, without long-term current use of insulin (HCC) - Primary   Relevant Orders   Bayer DCA Hb A1c Waived     Other   Hyperlipidemia LDL goal <130    A1c looks good at 6.2, no change medication Follow up plan: Return in about 3 months (around 01/19/2022), or if symptoms worsen or fail to improve, for Diabetes hypertension cholesterol recheck.  Counseling provided for all of the vaccine components Orders Placed This Encounter  Procedures   Bayer DCA Hb A1c Waived    01/21/2022, MD [facility] 10/19/2021, 10:16 AM

## 2022-01-21 ENCOUNTER — Encounter: Payer: Self-pay | Admitting: Family Medicine

## 2022-01-21 ENCOUNTER — Ambulatory Visit (INDEPENDENT_AMBULATORY_CARE_PROVIDER_SITE_OTHER): Payer: BLUE CROSS/BLUE SHIELD

## 2022-01-21 ENCOUNTER — Ambulatory Visit: Payer: BLUE CROSS/BLUE SHIELD | Admitting: Family Medicine

## 2022-01-21 VITALS — BP 138/80 | HR 63 | Ht <= 58 in | Wt 126.0 lb

## 2022-01-21 DIAGNOSIS — E1140 Type 2 diabetes mellitus with diabetic neuropathy, unspecified: Secondary | ICD-10-CM

## 2022-01-21 DIAGNOSIS — M25512 Pain in left shoulder: Secondary | ICD-10-CM

## 2022-01-21 DIAGNOSIS — E785 Hyperlipidemia, unspecified: Secondary | ICD-10-CM | POA: Diagnosis not present

## 2022-01-21 DIAGNOSIS — K219 Gastro-esophageal reflux disease without esophagitis: Secondary | ICD-10-CM | POA: Diagnosis not present

## 2022-01-21 DIAGNOSIS — I10 Essential (primary) hypertension: Secondary | ICD-10-CM

## 2022-01-21 LAB — CMP14+EGFR
ALT: 14 IU/L (ref 0–32)
AST: 12 IU/L (ref 0–40)
Albumin/Globulin Ratio: 1.6 (ref 1.2–2.2)
Albumin: 4.3 g/dL (ref 3.8–4.9)
Alkaline Phosphatase: 93 IU/L (ref 44–121)
BUN/Creatinine Ratio: 13 (ref 9–23)
BUN: 10 mg/dL (ref 6–24)
Bilirubin Total: 0.4 mg/dL (ref 0.0–1.2)
CO2: 25 mmol/L (ref 20–29)
Calcium: 9.3 mg/dL (ref 8.7–10.2)
Chloride: 104 mmol/L (ref 96–106)
Creatinine, Ser: 0.75 mg/dL (ref 0.57–1.00)
Globulin, Total: 2.7 g/dL (ref 1.5–4.5)
Glucose: 118 mg/dL — ABNORMAL HIGH (ref 70–99)
Potassium: 4.3 mmol/L (ref 3.5–5.2)
Sodium: 140 mmol/L (ref 134–144)
Total Protein: 7 g/dL (ref 6.0–8.5)
eGFR: 92 mL/min/{1.73_m2} (ref >59)

## 2022-01-21 LAB — CBC WITH DIFFERENTIAL/PLATELET
Basophils Absolute: 0 10*3/uL (ref 0.0–0.2)
Basos: 0 %
EOS (ABSOLUTE): 0.2 10*3/uL (ref 0.0–0.4)
Eos: 4 %
Hematocrit: 40.1 % (ref 34.0–46.6)
Hemoglobin: 13 g/dL (ref 11.1–15.9)
Immature Grans (Abs): 0 10*3/uL (ref 0.0–0.1)
Immature Granulocytes: 0 %
Lymphocytes Absolute: 1.7 10*3/uL (ref 0.7–3.1)
Lymphs: 30 %
MCH: 28 pg (ref 26.6–33.0)
MCHC: 32.4 g/dL (ref 31.5–35.7)
MCV: 86 fL (ref 79–97)
Monocytes Absolute: 0.5 10*3/uL (ref 0.1–0.9)
Monocytes: 8 %
Neutrophils Absolute: 3.3 10*3/uL (ref 1.4–7.0)
Neutrophils: 58 %
Platelets: 244 10*3/uL (ref 150–450)
RBC: 4.65 x10E6/uL (ref 3.77–5.28)
RDW: 13 % (ref 11.7–15.4)
WBC: 5.8 10*3/uL (ref 3.4–10.8)

## 2022-01-21 LAB — LIPID PANEL
Chol/HDL Ratio: 2.3 ratio (ref 0.0–4.4)
Cholesterol, Total: 178 mg/dL (ref 100–199)
HDL: 78 mg/dL (ref >39)
LDL Chol Calc (NIH): 90 mg/dL (ref 0–99)
Triglycerides: 49 mg/dL (ref 0–149)
VLDL Cholesterol Cal: 10 mg/dL (ref 5–40)

## 2022-01-21 LAB — BAYER DCA HB A1C WAIVED: HB A1C (BAYER DCA - WAIVED): 6.1 % — ABNORMAL HIGH (ref 4.8–5.6)

## 2022-01-21 MED ORDER — PRAVASTATIN SODIUM 20 MG PO TABS: 20.0000 mg | ORAL_TABLET | Freq: Every day | ORAL | 3 refills | Status: DC

## 2022-01-21 MED ORDER — VALSARTAN 160 MG PO TABS: 160.0000 mg | ORAL_TABLET | Freq: Every day | ORAL | 3 refills | Status: DC

## 2022-01-21 NOTE — Progress Notes (Signed)
BP 138/80    Pulse 63    Ht 4' 9" (1.448 m)    Wt 126 lb (57.2 kg)    SpO2 98%    BMI 27.27 kg/m    Subjective:   Patient ID: Amy Mccarty, female    DOB: July 19, 1961, 61 y.o.   MRN: [id number]  HPI: Amy Mccarty is a 61 y.o. female presenting on 01/21/2022 for Medical Management of Chronic Issues, Diabetes, Hyperlipidemia, and Hypertension   HPI Hypertension Patient is currently on amlodipine and valsartan, and their blood pressure today is 146/86. Patient denies any lightheadedness or dizziness. Patient denies blurred vision, chest pains, shortness of breath, or weakness. Denies any side effects from medication and is content with current medication.  Patient was having headaches overnight and says her blood pressures been running up a little bit and was concerned about that.  She said the headaches is on the left side and top of her head and it felt like a throbbing and pulsating  Hyperlipidemia Patient is coming in for recheck of his hyperlipidemia. The patient is currently taking pravastatin. They deny any issues with myalgias or history of liver damage from it. They deny any focal numbness or weakness or chest pain.   Type 2 diabetes mellitus Patient comes in today for recheck of his diabetes. Patient has been currently taking diet controlled, A1c looks good at 6.1. Patient is currently on an ACE inhibitor/ARB. Patient has seen an ophthalmologist this year. Patient denies any issues with their feet except onychomycosis. The symptom started onset as an adult hypertension and hyperlipidemia ARE RELATED TO DM   Left shoulder pain. Patient says she has been having left shoulder pain over the past month.  She says she was lifting and moving and has gotten it worse.  It does hurt mostly anteriorly and with range of motion it is worsened with lifting is worse.  She does have a little pain posteriorly but most of the pain is been anterior.  Relevant past medical, surgical, family and  social history reviewed and updated as indicated. Interim medical history since our last visit reviewed. Allergies and medications reviewed and updated.  Review of Systems  Constitutional:  Negative for chills and fever.  Eyes:  Negative for redness and visual disturbance.  Respiratory:  Negative for chest tightness and shortness of breath.   Cardiovascular:  Negative for chest pain and leg swelling.  Genitourinary:  Negative for difficulty urinating and dysuria.  Musculoskeletal:  Positive for arthralgias. Negative for back pain and gait problem.  Skin:  Negative for rash.  Neurological:  Positive for headaches (She has had a little bit of headaches and her blood pressures been up some, will recheck today.  She denies headache today). Negative for light-headedness.  Psychiatric/Behavioral:  Negative for agitation and behavioral problems.   All other systems reviewed and are negative.  Per HPI unless specifically indicated above   Allergies as of 01/21/2022       Reactions   Ibuprofen Rash   Naproxen Rash        Medication List        Accurate as of January 21, 2022 11:55 AM. If you have any questions, ask your nurse or doctor.          amLODipine 5 MG tablet Commonly known as: NORVASC Take 1 tablet (5 mg total) by mouth daily.   folic acid 1 MG tablet Commonly known as: FOLVITE Take 1 mg by mouth daily.   pantoprazole 40  MG tablet Commonly known as: PROTONIX Take 1 tablet (40 mg total) by mouth daily.   pravastatin 20 MG tablet Commonly known as: PRAVACHOL Take 1 tablet (20 mg total) by mouth daily.   valsartan 160 MG tablet Commonly known as: DIOVAN Take 1 tablet (160 mg total) by mouth daily.         Objective:   BP 138/80    Pulse 63    Ht 4' 9" (1.448 m)    Wt 126 lb (57.2 kg)    SpO2 98%    BMI 27.27 kg/m   Wt Readings from Last 3 Encounters:  01/21/22 126 lb (57.2 kg)  10/19/21 124 lb (56.2 kg)  07/19/21 123 lb (55.8 kg)    Physical  Exam Vitals and nursing note reviewed.  Constitutional:      General: She is not in acute distress.    Appearance: She is well-developed. She is not diaphoretic.  Eyes:     Conjunctiva/sclera: Conjunctivae normal.  Cardiovascular:     Rate and Rhythm: Normal rate and regular rhythm.     Heart sounds: Normal heart sounds. No murmur heard. Pulmonary:     Effort: Pulmonary effort is normal. No respiratory distress.     Breath sounds: Normal breath sounds. No wheezing.  Musculoskeletal:        General: Normal range of motion.     Left shoulder: Tenderness (Anterior tenderness over bicep tendon) present. No deformity, laceration, bony tenderness or crepitus. Normal range of motion.     Comments: Pain with range of motion of left shoulder both in flexion and internal rotation  Skin:    General: Skin is warm and dry.     Findings: No rash.  Neurological:     Mental Status: She is alert and oriented to person, place, and time.     Coordination: Coordination normal.  Psychiatric:        Behavior: Behavior normal.      Assessment & Plan:   Problem List Items Addressed This Visit       Cardiovascular and Mediastinum   Essential hypertension   Relevant Orders   CBC with Differential/Platelet   CMP14+EGFR   Lipid panel   Bayer DCA Hb A1c Waived     Digestive   GERD (gastroesophageal reflux disease)     Endocrine   Type 2 diabetes mellitus with diabetic neuropathy, without long-term current use of insulin (HCC) - Primary   Relevant Orders   CBC with Differential/Platelet   CMP14+EGFR   Lipid panel   Bayer DCA Hb A1c Waived     Other   Hyperlipidemia LDL goal <130   Relevant Orders   CBC with Differential/Platelet   CMP14+EGFR   Lipid panel   Bayer DCA Hb A1c Waived   Other Visit Diagnoses     Acute pain of left shoulder       Relevant Orders   DG Shoulder Left       A1c looks good, continue medicine for diabetes.  It was 6.1.  Hypertension shows elevated blood  pressure recheck was 138/80, instructed patient to take sinus medicine and let us know if blood pressure continues to be elevated or if continues to have headaches.  Likely sounds like from sinus congestion and pressure.  For left shoulder she is going to use topical over-the-counter Follow up plan: Return in about 3 months (around 04/21/2022), or if symptoms worsen or fail to improve, for Diabetes hypertension.  Counseling provided for all of the vaccine  components Orders Placed This Encounter  Procedures   DG Shoulder Left   CBC with Differential/Platelet   CMP14+EGFR   Lipid panel   Bayer DCA Hb A1c Waived    Caryl Pina, MD [facility] 01/21/2022, 11:55 AM

## 2022-03-09 ENCOUNTER — Ambulatory Visit
Admission: EM | Admit: 2022-03-09 | Discharge: 2022-03-09 | Disposition: A | Discharge status: Home / Self Care | Payer: BLUE CROSS/BLUE SHIELD | Attending: Family Medicine | Admitting: Family Medicine

## 2022-03-09 ENCOUNTER — Other Ambulatory Visit: Payer: Self-pay

## 2022-03-09 ENCOUNTER — Encounter: Payer: Self-pay | Admitting: Emergency Medicine

## 2022-03-09 DIAGNOSIS — M549 Dorsalgia, unspecified: Secondary | ICD-10-CM | POA: Insufficient documentation

## 2022-03-09 DIAGNOSIS — R350 Frequency of micturition: Secondary | ICD-10-CM | POA: Insufficient documentation

## 2022-03-09 DIAGNOSIS — N760 Acute vaginitis: Secondary | ICD-10-CM | POA: Diagnosis not present

## 2022-03-09 LAB — POCT URINALYSIS DIP (MANUAL ENTRY)
Bilirubin, UA: NEGATIVE
Blood, UA: NEGATIVE
Glucose, UA: NEGATIVE mg/dL
Ketones, POC UA: NEGATIVE mg/dL
Leukocytes, UA: NEGATIVE
Nitrite, UA: NEGATIVE
Protein Ur, POC: NEGATIVE mg/dL
Spec Grav, UA: >=1.03 — AB (ref 1.010–1.025)
Urobilinogen, UA: 0.2 E.U./dL
pH, UA: 5.5 (ref 5.0–8.0)

## 2022-03-09 MED ORDER — CYCLOBENZAPRINE HCL 5 MG PO TABS: 5.0000 mg | ORAL_TABLET | Freq: Three times a day (TID) | ORAL | 0 refills | Status: DC | PRN

## 2022-03-09 NOTE — ED Provider Notes (Signed)
?[facility] ? ? ? ?CSN: [id number] ?Arrival date & time: 03/09/22  1007 ? ? ?  ? ?History   ?Chief Complaint ?Chief Complaint  ?Patient presents with  ? Urinary Frequency  ? ? ?HPI ?Amy Mccarty is a 61 y.o. female.  ? ?Presenting today with 3-day history of mid back pain worse when she is up and moving or laying in bed certain ways.  She states the pain is mainly aching and pressure-like in nature.  Denies any radiation of pain down legs, numbness, weakness, tingling, midline pain, injury to the area, fever, chills, abdominal pain.  She states she has had some urinary frequency for the past day or so, took a home UTI test and states that the result was positive.  Took some over-the-counter cranberry pills with mild relief.  Denies vaginal symptoms. ? ? ?Past Medical History:  ?Diagnosis Date  ? GERD (gastroesophageal reflux disease)   ? Hyperlipidemia   ? Hypertension   ? ? ?Patient Active Problem List  ? Diagnosis Date Noted  ? Type 2 diabetes mellitus with diabetic neuropathy, without long-term current use of insulin ([place]) 12/15/2020  ? Dysesthesia of scalp 11/29/2020  ? Essential hypertension 12/21/2019  ? BMI 29.0-29.9,adult 06/18/2019  ? Skin tag of perianal region 12/07/2018  ? Refuses tetanus, diphtheria, and acellular pertussis (Tdap) vaccination 12/07/2018  ? Refused influenza vaccine 12/07/2018  ? Dyspareunia in female 12/07/2018  ? Skin tags, multiple acquired 12/07/2018  ? Hyperlipidemia LDL goal <130 11/30/2018  ? GERD (gastroesophageal reflux disease) 11/30/2018  ? ? ?Past Surgical History:  ?Procedure Laterality Date  ? CESAREAN SECTION    ? COLPOSCOPY    ? ? ?OB History   ?No obstetric history on file. ?  ? ? ? ?Home Medications   ? ?Prior to Admission medications   ?Medication Sig Start Date End Date Taking? Authorizing Provider  ?cyclobenzaprine (FLEXERIL) 5 MG tablet Take 1 tablet (5 mg total) by mouth 3 (three) times daily as needed for muscle spasms. Do not drink alcohol or drive  while taking this medication.  May cause drowsiness. 03/09/22  Yes Volney American, PA-C  ?amLODipine (NORVASC) 5 MG tablet Take 1 tablet (5 mg total) by mouth daily. 07/19/21   Dettinger, Fransisca Kaufmann, MD  ?folic acid (FOLVITE) 1 MG tablet Take 1 mg by mouth daily. 02/01/20   [provider]  ?pantoprazole (PROTONIX) 40 MG tablet Take 1 tablet (40 mg total) by mouth daily. 10/19/21   Dettinger, Fransisca Kaufmann, MD  ?pravastatin (PRAVACHOL) 20 MG tablet Take 1 tablet (20 mg total) by mouth daily. 01/21/22   Dettinger, Fransisca Kaufmann, MD  ?valsartan (DIOVAN) 160 MG tablet Take 1 tablet (160 mg total) by mouth daily. 01/21/22   Dettinger, Fransisca Kaufmann, MD  ? ? ?Family History ?Family History  ?Problem Relation Age of Onset  ? Diabetes Mother   ? Hyperlipidemia Mother   ? Hypertension Mother   ? Anuerysm Mother   ? Stroke Father   ? Diabetes Brother   ? Hyperlipidemia Brother   ? Hypertension Brother   ? ? ?Social History ?Social History  ? ?Tobacco Use  ? Smoking status: Never  ? Smokeless tobacco: Never  ?Substance Use Topics  ? Alcohol use: Yes  ?  Comment: occ  ? Drug use: Never  ? ? ? ?Allergies   ?Ibuprofen and Naproxen ? ? ?Review of Systems ?Review of Systems ?Per HPI ? ?Physical Exam ?Triage Vital Signs ?ED Triage Vitals  ?Enc Vitals Group  ?  BP 03/09/22 1200 (!) 158/89  ?   Pulse Rate 03/09/22 1200 68  ?   Resp 03/09/22 1200 18  ?   Temp 03/09/22 1200 98.8 ?F (37.1 ?C)  ?   Temp Source 03/09/22 1200 Oral  ?   SpO2 03/09/22 1200 98 %  ?   Weight 03/09/22 1201 127 lb (57.6 kg)  ?   Height 03/09/22 1201 4\' 9"  (1.448 m)  ?   Head Circumference --   ?   Peak Flow --   ?   Pain Score 03/09/22 1201 8  ?   Pain Loc --   ?   Pain Edu? --   ?   Excl. in [place]? --   ? ?No data found. ? ?Updated Vital Signs ?BP (!) 158/89 (BP Location: Right Arm)   Pulse 68   Temp 98.8 ?F (37.1 ?C) (Oral)   Resp 18   Ht 4\' 9"  (1.448 m)   Wt 127 lb (57.6 kg)   SpO2 98%   BMI 27.48 kg/m?  ? ?Visual Acuity ?Right Eye Distance:   ?Left Eye  Distance:   ?Bilateral Distance:   ? ?Right Eye Near:   ?Left Eye Near:    ?Bilateral Near:    ? ?Physical Exam ?Vitals and nursing note reviewed.  ?Constitutional:   ?   Appearance: Normal appearance. She is not ill-appearing.  ?HENT:  ?   Head: Atraumatic.  ?Eyes:  ?   Extraocular Movements: Extraocular movements intact.  ?   Conjunctiva/sclera: Conjunctivae normal.  ?Cardiovascular:  ?   Rate and Rhythm: Normal rate and regular rhythm.  ?   Heart sounds: Normal heart sounds.  ?Pulmonary:  ?   Effort: Pulmonary effort is normal.  ?   Breath sounds: Normal breath sounds.  ?Abdominal:  ?   General: Bowel sounds are normal. There is no distension.  ?   Palpations: Abdomen is soft.  ?   Tenderness: There is no abdominal tenderness. There is no right CVA tenderness, left CVA tenderness or guarding.  ?Musculoskeletal:     ?   General: No swelling, tenderness, deformity or signs of injury. Normal range of motion.  ?   Cervical back: Normal range of motion and neck supple.  ?   Comments: Midline spinal tenderness to palpation diffusely.  Negative straight leg raise bilaterally.  Normal gait and movements though mild reproduction of pain with twisting and bending.  Negative CVA tenderness  ?Skin: ?   General: Skin is warm and dry.  ?Neurological:  ?   Mental Status: She is alert and oriented to person, place, and time.  ?   Motor: No weakness.  ?   Gait: Gait normal.  ?Psychiatric:     ?   Mood and Affect: Mood normal.     ?   Thought Content: Thought content normal.     ?   Judgment: Judgment normal.  ? ? ? ?UC Treatments / Results  ?Labs ?(all labs ordered are listed, but only abnormal results are displayed) ?Labs Reviewed  ?POCT URINALYSIS DIP (MANUAL ENTRY) - Abnormal; Notable for the following components:  ?    Result Value  ? Spec Grav, UA >=1.030 (*)   ? All other components within normal limits  ?CERVICOVAGINAL ANCILLARY ONLY  ? ? ?EKG ? ? ?Radiology ?No results found. ? ?Procedures ?Procedures (including critical  care time) ? ?Medications Ordered in UC ?Medications - No data to display ? ?Initial Impression / Assessment and Plan / UC Course  ?  I have reviewed the triage vital signs and the nursing notes. ? ?Pertinent labs & imaging results that were available during my care of the patient were reviewed by me and considered in my medical decision making (see chart for details). ? ?  ? ?Vital signs and exam overall reassuring today, UA negative for urinary tract infection.  Vaginal swab pending for further rule out of urinary symptoms.  Possibly inflammatory/muscular cause for her mid back pain.  Low suspicion for it to be connected with her urinary frequency.  Treat with low-dose Flexeril at bedtime.  Discussed to push fluids, continue to monitor and return for worsening symptoms. ? ?Final Clinical Impressions(s) / UC Diagnoses  ? ?Final diagnoses:  ?Mid back pain  ?Urinary frequency  ? ?Discharge Instructions   ?None ?  ? ?ED Prescriptions   ? ? Medication Sig Dispense Auth. Provider  ? cyclobenzaprine (FLEXERIL) 5 MG tablet Take 1 tablet (5 mg total) by mouth 3 (three) times daily as needed for muscle spasms. Do not drink alcohol or drive while taking this medication.  May cause drowsiness. 15 tablet Volney American, [place]  ? ?  ? ?PDMP not reviewed this encounter. ?  ?Volney American, PA-C ?03/09/22 1307 ? ?

## 2022-03-09 NOTE — ED Triage Notes (Signed)
Pt reports urinary frequency, lower abd pain, chills, lower back pain x3 days. Pt reports took "home uti test" with positive result. Taking otc cranberry pills as well. ?

## 2022-03-11 ENCOUNTER — Telehealth (HOSPITAL_COMMUNITY): Payer: Self-pay | Admitting: Emergency Medicine

## 2022-03-11 LAB — CERVICOVAGINAL ANCILLARY ONLY
Bacterial Vaginitis (gardnerella): POSITIVE — AB
Candida Glabrata: NEGATIVE
Candida Vaginitis: NEGATIVE
Comment: NEGATIVE
Comment: NEGATIVE
Comment: NEGATIVE

## 2022-03-11 MED ORDER — METRONIDAZOLE 500 MG PO TABS: 500.0000 mg | ORAL_TABLET | Freq: Two times a day (BID) | ORAL | 0 refills | Status: DC

## 2022-04-14 ENCOUNTER — Other Ambulatory Visit: Payer: Self-pay | Admitting: Family Medicine

## 2022-04-14 DIAGNOSIS — I10 Essential (primary) hypertension: Secondary | ICD-10-CM

## 2022-04-16 DIAGNOSIS — R87810 Cervical high risk human papillomavirus (HPV) DNA test positive: Secondary | ICD-10-CM | POA: Diagnosis not present

## 2022-04-16 DIAGNOSIS — Z01419 Encounter for gynecological examination (general) (routine) without abnormal findings: Secondary | ICD-10-CM | POA: Diagnosis not present

## 2022-04-16 DIAGNOSIS — Z6827 Body mass index (BMI) 27.0-27.9, adult: Secondary | ICD-10-CM | POA: Diagnosis not present

## 2022-04-16 DIAGNOSIS — N952 Postmenopausal atrophic vaginitis: Secondary | ICD-10-CM | POA: Diagnosis not present

## 2022-04-16 DIAGNOSIS — Z124 Encounter for screening for malignant neoplasm of cervix: Secondary | ICD-10-CM | POA: Diagnosis not present

## 2022-04-16 DIAGNOSIS — M25512 Pain in left shoulder: Secondary | ICD-10-CM | POA: Diagnosis not present

## 2022-04-16 DIAGNOSIS — Z1231 Encounter for screening mammogram for malignant neoplasm of breast: Secondary | ICD-10-CM | POA: Diagnosis not present

## 2022-04-16 DIAGNOSIS — Z113 Encounter for screening for infections with a predominantly sexual mode of transmission: Secondary | ICD-10-CM | POA: Diagnosis not present

## 2022-04-16 DIAGNOSIS — R87612 Low grade squamous intraepithelial lesion on cytologic smear of cervix (LGSIL): Secondary | ICD-10-CM | POA: Diagnosis not present

## 2022-04-29 ENCOUNTER — Ambulatory Visit: Payer: BLUE CROSS/BLUE SHIELD | Admitting: Family Medicine

## 2022-04-29 ENCOUNTER — Encounter: Payer: Self-pay | Admitting: Family Medicine

## 2022-04-29 VITALS — BP 129/77 | HR 68 | Ht <= 58 in | Wt 124.0 lb

## 2022-04-29 DIAGNOSIS — M7552 Bursitis of left shoulder: Secondary | ICD-10-CM

## 2022-04-29 DIAGNOSIS — M5431 Sciatica, right side: Secondary | ICD-10-CM

## 2022-04-29 DIAGNOSIS — I10 Essential (primary) hypertension: Secondary | ICD-10-CM

## 2022-04-29 DIAGNOSIS — E1140 Type 2 diabetes mellitus with diabetic neuropathy, unspecified: Secondary | ICD-10-CM | POA: Diagnosis not present

## 2022-04-29 DIAGNOSIS — E785 Hyperlipidemia, unspecified: Secondary | ICD-10-CM

## 2022-04-29 LAB — BAYER DCA HB A1C WAIVED: HB A1C (BAYER DCA - WAIVED): 6.6 % — ABNORMAL HIGH (ref 4.8–5.6)

## 2022-04-29 MED ORDER — AMLODIPINE BESYLATE 5 MG PO TABS: ORAL_TABLET | ORAL | 3 refills | Status: DC

## 2022-04-29 MED ORDER — PREDNISONE 20 MG PO TABS: ORAL_TABLET | ORAL | 0 refills | Status: DC

## 2022-04-29 NOTE — Progress Notes (Addendum)
? ?BP 129/77   Pulse 68   Ht 4\' 9"  (1.448 m)   Wt 124 lb (56.2 kg)   SpO2 99%   BMI 26.83 kg/m?   ? ?Subjective:  ? ?Patient ID: Amy Mccarty, female    DOB: 1961/09/29, 61 y.o.   MRN: [id number] ? ?HPI: ?Amy Mccarty is a 60 y.o. female presenting on 04/29/2022 for Medical Management of Chronic Issues, Diabetes, Hyperlipidemia, and Shoulder Pain (left) ? ? ?HPI ?Type 2 diabetes mellitus ?Patient comes in today for recheck of his diabetes. Patient has been currently taking no medicine, diet control. Patient is currently on an ACE inhibitor/ARB. Patient has not seen an ophthalmologist this year. Patient denies any issues with their feet. The symptom started onset as an adult hyperlipidemia and hypertension ARE RELATED TO DM  ? ?Hyperlipidemia ?Patient is coming in for recheck of his hyperlipidemia. The patient is currently taking pravastatin. They deny any issues with myalgias or history of liver damage from it. They deny any focal numbness or weakness or chest pain.  ? ?Hypertension ?Patient is currently on amlodipine and valsartan, and their blood pressure today is 129/77. Patient denies any lightheadedness or dizziness. Patient denies headaches, blurred vision, chest pains, shortness of breath, or weakness. Denies any side effects from medication and is content with current medication.  ? ?Left shoulder pain recheck ?Patient is coming in with continued left shoulder pain.  She has used some topical things such as Voltaren gel and Biofreeze and they have helped a little bit but not significantly.  She is still fighting the issues with that.  She says she is wants to try something else but does not want to do an injection.  It still hurts a lot with overhead range of motion and forward and back in the middle of that left shoulder.  It also sometimes hurts towards the back over her scapula as well. ? ?Relevant past medical, surgical, family and social history reviewed and updated as indicated. Interim medical  history since our last visit reviewed. ?Allergies and medications reviewed and updated. ? ?Review of Systems  ?Constitutional:  Negative for chills and fever.  ?Eyes:  Negative for visual disturbance.  ?Respiratory:  Negative for chest tightness and shortness of breath.   ?Cardiovascular:  Negative for chest pain and leg swelling.  ?Musculoskeletal:  Positive for arthralgias and back pain (Sciatic pain on right side and left side sometimes). Negative for gait problem, joint swelling and myalgias.  ?Skin:  Negative for rash.  ?Neurological:  Negative for light-headedness and headaches.  ?Psychiatric/Behavioral:  Negative for agitation and behavioral problems.   ?All other systems reviewed and are negative. ? ?Per HPI unless specifically indicated above ? ? ?Allergies as of 04/29/2022   ? ?   Reactions  ? Ibuprofen Rash  ? Naproxen Rash  ? ?  ? ?  ?Medication List  ?  ? ?  ? Accurate as of Apr 29, 2022 12:10 PM. If you have any questions, ask your nurse or doctor.  ?  ?  ? ?  ? ?amLODipine 5 MG tablet ?Commonly known as: NORVASC ?TAKE 1 TABLET(5 MG) BY MOUTH DAILY ?  ?cyclobenzaprine 5 MG tablet ?Commonly known as: FLEXERIL ?Take 1 tablet (5 mg total) by mouth 3 (three) times daily as needed for muscle spasms. Do not drink alcohol or drive while taking this medication.  May cause drowsiness. ?  ?folic acid 1 MG tablet ?Commonly known as: FOLVITE ?Take 1 mg by mouth daily. ?  ?metroNIDAZOLE  500 MG tablet ?Commonly known as: FLAGYL ?Take 1 tablet (500 mg total) by mouth 2 (two) times daily. ?  ?pantoprazole 40 MG tablet ?Commonly known as: PROTONIX ?Take 1 tablet (40 mg total) by mouth daily. ?  ?pravastatin 20 MG tablet ?Commonly known as: PRAVACHOL ?Take 1 tablet (20 mg total) by mouth daily. ?  ?predniSONE 20 MG tablet ?Commonly known as: DELTASONE ?2 po at same time daily for 5 days ?Started by: Worthy Rancher, MD ?  ?valsartan 160 MG tablet ?Commonly known as: DIOVAN ?Take 1 tablet (160 mg total) by mouth daily. ?   ? ?  ? ? ? ?Objective:  ? ?BP 129/77   Pulse 68   Ht 4\' 9"  (1.448 m)   Wt 124 lb (56.2 kg)   SpO2 99%   BMI 26.83 kg/m?   ?Wt Readings from Last 3 Encounters:  ?04/29/22 124 lb (56.2 kg)  ?03/09/22 127 lb (57.6 kg)  ?01/21/22 126 lb (57.2 kg)  ?  ?Physical Exam ?Vitals and nursing note reviewed.  ?Constitutional:   ?   General: She is not in acute distress. ?   Appearance: She is well-developed. She is not diaphoretic.  ?Eyes:  ?   Conjunctiva/sclera: Conjunctivae normal.  ?Cardiovascular:  ?   Rate and Rhythm: Normal rate and regular rhythm.  ?   Heart sounds: Normal heart sounds. No murmur heard. ?Pulmonary:  ?   Effort: Pulmonary effort is normal. No respiratory distress.  ?   Breath sounds: Normal breath sounds. No wheezing.  ?Musculoskeletal:     ?   General: No tenderness. Normal range of motion.  ?   Left shoulder: No swelling, deformity, tenderness or crepitus. Normal range of motion (No decreased range of motion but pain with range of motion in both anterior overhead and behind her body.  No weakness). Normal strength.  ?   Lumbar back: No deformity, tenderness or bony tenderness. Normal range of motion. Negative right straight leg raise test and negative left straight leg raise test.  ?Skin: ?   General: Skin is warm and dry.  ?   Findings: No rash.  ?Neurological:  ?   Mental Status: She is alert and oriented to person, place, and time.  ?   Coordination: Coordination normal.  ?Psychiatric:     ?   Behavior: Behavior normal.  ? ? ? ? ?Assessment & Plan:  ? ?Problem List Items Addressed This Visit   ? ?  ? Cardiovascular and Mediastinum  ? Essential hypertension  ? Relevant Medications  ? amLODipine (NORVASC) 5 MG tablet  ?  ? Endocrine  ? Type 2 diabetes mellitus with diabetic neuropathy, without long-term current use of insulin ([place]) - Primary  ? Relevant Orders  ? Bayer DCA Hb A1c Waived (Completed)  ?  ? Other  ? Hyperlipidemia LDL goal <130  ? Relevant Medications  ? amLODipine (NORVASC) 5 MG  tablet  ? ?Other Visit Diagnoses   ? ? Right sciatic nerve pain      ? Relevant Medications  ? predniSONE (DELTASONE) 20 MG tablet  ? Other Relevant Orders  ? Ambulatory referral to Physical Therapy  ? Subacromial bursitis of left shoulder joint      ? Relevant Medications  ? predniSONE (DELTASONE) 20 MG tablet  ? Other Relevant Orders  ? Ambulatory referral to Physical Therapy  ? ?  ?  ?Patient has intermittent right static nerve pain and continued left shoulder pain, wants physical therapy referral, will give short  course of prednisone as well.  Discussed injection but she wants to wait on that. ? ?Continue blood pressure medicine and focus on diet for diabetes. ?Follow up plan: ?Return in about 3 months (around 07/30/2022), or if symptoms worsen or fail to improve, for Diabetes and hypertension and cholesterol. ? ?Counseling provided for all of the vaccine components ?Orders Placed This Encounter  ?Procedures  ? Bayer DCA Hb A1c Waived  ? Ambulatory referral to Physical Therapy  ? ? ?Caryl Pina, MD ?[place] ?04/29/2022, 12:10 PM ? ? ? ? ?

## 2022-05-29 ENCOUNTER — Other Ambulatory Visit: Payer: Self-pay | Admitting: Family Medicine

## 2022-05-29 NOTE — Telephone Encounter (Signed)
[facility] patient- not provider not PCP Requested Prescriptions  Pending Prescriptions Disp Refills  . cyclobenzaprine (FLEXERIL) 5 MG tablet [Pharmacy Med Name: CYCLOBENZAPRINE 5MG  TABLETS] 15 tablet 0    Sig: TAKE 1 TABLET BY MOUTH THREE TIMES DAILY AS NEEDED FOR MUSCLE SPASMS. DO NOT DRINK ALCOHOL OR DRIVE WHILE TAKING THIS MEDICATION. MAY CAUSE DROWSINESS     There is no refill protocol information for this order

## 2022-07-03 DIAGNOSIS — H40033 Anatomical narrow angle, bilateral: Secondary | ICD-10-CM | POA: Diagnosis not present

## 2022-07-03 DIAGNOSIS — H04123 Dry eye syndrome of bilateral lacrimal glands: Secondary | ICD-10-CM | POA: Diagnosis not present

## 2022-08-01 ENCOUNTER — Ambulatory Visit: Payer: BLUE CROSS/BLUE SHIELD | Admitting: Family Medicine

## 2022-08-02 ENCOUNTER — Ambulatory Visit: Payer: BLUE CROSS/BLUE SHIELD | Admitting: Family Medicine

## 2022-08-02 ENCOUNTER — Encounter: Payer: Self-pay | Admitting: Family Medicine

## 2022-08-02 VITALS — BP 126/70 | HR 59 | Temp 98.2°F | Ht <= 58 in | Wt 123.0 lb

## 2022-08-02 DIAGNOSIS — E1140 Type 2 diabetes mellitus with diabetic neuropathy, unspecified: Secondary | ICD-10-CM

## 2022-08-02 DIAGNOSIS — I10 Essential (primary) hypertension: Secondary | ICD-10-CM | POA: Diagnosis not present

## 2022-08-02 DIAGNOSIS — B351 Tinea unguium: Secondary | ICD-10-CM | POA: Diagnosis not present

## 2022-08-02 DIAGNOSIS — E785 Hyperlipidemia, unspecified: Secondary | ICD-10-CM

## 2022-08-02 DIAGNOSIS — K219 Gastro-esophageal reflux disease without esophagitis: Secondary | ICD-10-CM

## 2022-08-02 LAB — BAYER DCA HB A1C WAIVED: HB A1C (BAYER DCA - WAIVED): 7 % — ABNORMAL HIGH (ref 4.8–5.6)

## 2022-08-02 MED ORDER — PRAVASTATIN SODIUM 20 MG PO TABS: 20.0000 mg | ORAL_TABLET | Freq: Every day | ORAL | 11 refills | Status: DC

## 2022-08-02 MED ORDER — PANTOPRAZOLE SODIUM 40 MG PO TBEC: 40.0000 mg | DELAYED_RELEASE_TABLET | Freq: Every day | ORAL | 11 refills | Status: DC

## 2022-08-02 MED ORDER — VALSARTAN 160 MG PO TABS: 160.0000 mg | ORAL_TABLET | Freq: Every day | ORAL | 11 refills | Status: DC

## 2022-08-02 MED ORDER — CICLOPIROX 8 % EX SOLN: Freq: Every day | CUTANEOUS | 1 refills | Status: DC

## 2022-08-02 MED ORDER — AMLODIPINE BESYLATE 5 MG PO TABS: ORAL_TABLET | ORAL | 11 refills | Status: DC

## 2022-08-02 NOTE — Progress Notes (Signed)
BP 126/70   Pulse (!) 59   Temp 98.2 F (36.8 C)   Ht '4\' 9"'  (1.448 m)   Wt 123 lb (55.8 kg)   SpO2 100%   BMI 26.62 kg/m    Subjective:   Patient ID: Amy Mccarty, female    DOB: 06/20/1961, 61 y.o.   MRN: [id number]  HPI: Tuyen Uncapher is a 61 y.o. female presenting on 08/02/2022 for Medical Management of Chronic Issues, Diabetes, and Hyperlipidemia   HPI Type 2 diabetes mellitus Patient comes in today for recheck of his diabetes. Patient has been currently taking diet control, she does states has been eating out a lot more and her diet is not doing as well.. Patient is currently on an ACE inhibitor/ARB. Patient has not seen an ophthalmologist this year. Patient denies any issues with their feet. The symptom started onset as an adult hypertension and hyperlipidemia ARE RELATED TO DM   Hyperlipidemia Patient is coming in for recheck of his hyperlipidemia. The patient is currently taking pravastatin. They deny any issues with myalgias or history of liver damage from it. They deny any focal numbness or weakness or chest pain.   Hypertension Patient is currently on amlodipine and valsartan, and their blood pressure today is 126/70. Patient denies any lightheadedness or dizziness. Patient denies headaches, blurred vision, chest pains, shortness of breath, or weakness. Denies any side effects from medication and is content with current medication.   Relevant past medical, surgical, family and social history reviewed and updated as indicated. Interim medical history since our last visit reviewed. Allergies and medications reviewed and updated.  Review of Systems  Constitutional:  Negative for chills and fever.  Eyes:  Negative for visual disturbance.  Respiratory:  Negative for chest tightness and shortness of breath.   Cardiovascular:  Negative for chest pain and leg swelling.  Musculoskeletal:  Negative for back pain and gait problem.  Skin:  Negative for rash.  Neurological:   Negative for light-headedness and headaches.  Psychiatric/Behavioral:  Negative for agitation and behavioral problems.   All other systems reviewed and are negative.   Per HPI unless specifically indicated above   Allergies as of 08/02/2022       Reactions   Ibuprofen Rash   Naproxen Rash        Medication List        Accurate as of August 02, 2022 10:56 AM. If you have any questions, ask your nurse or doctor.          amLODipine 5 MG tablet Commonly known as: NORVASC TAKE 1 TABLET(5 MG) BY MOUTH DAILY   ciclopirox 8 % solution Commonly known as: PENLAC Apply topically at bedtime. Apply over nail and surrounding skin. Apply daily over previous coat. After seven (7) days, may remove with alcohol and continue cycle. Started by: Worthy Rancher, MD   cyclobenzaprine 5 MG tablet Commonly known as: FLEXERIL Take 1 tablet (5 mg total) by mouth 3 (three) times daily as needed for muscle spasms. Do not drink alcohol or drive while taking this medication.  May cause drowsiness.   folic acid 1 MG tablet Commonly known as: FOLVITE Take 1 mg by mouth daily.   metroNIDAZOLE 500 MG tablet Commonly known as: FLAGYL Take 1 tablet (500 mg total) by mouth 2 (two) times daily.   pantoprazole 40 MG tablet Commonly known as: PROTONIX Take 1 tablet (40 mg total) by mouth daily.   pravastatin 20 MG tablet Commonly known as: PRAVACHOL Take 1 tablet (  20 mg total) by mouth daily.   predniSONE 20 MG tablet Commonly known as: DELTASONE 2 po at same time daily for 5 days   valsartan 160 MG tablet Commonly known as: DIOVAN Take 1 tablet (160 mg total) by mouth daily.         Objective:   BP 126/70   Pulse (!) 59   Temp 98.2 F (36.8 C)   Ht '4\' 9"'  (1.448 m)   Wt 123 lb (55.8 kg)   SpO2 100%   BMI 26.62 kg/m   Wt Readings from Last 3 Encounters:  08/02/22 123 lb (55.8 kg)  04/29/22 124 lb (56.2 kg)  03/09/22 127 lb (57.6 kg)    Physical Exam Vitals and nursing  note reviewed.  Constitutional:      General: She is not in acute distress.    Appearance: She is well-developed. She is not diaphoretic.  Eyes:     Conjunctiva/sclera: Conjunctivae normal.  Cardiovascular:     Rate and Rhythm: Normal rate and regular rhythm.     Heart sounds: Normal heart sounds. No murmur heard. Pulmonary:     Effort: Pulmonary effort is normal. No respiratory distress.     Breath sounds: Normal breath sounds. No wheezing.  Skin:    General: Skin is warm and dry.     Findings: No rash.  Neurological:     Mental Status: She is alert and oriented to person, place, and time.     Coordination: Coordination normal.  Psychiatric:        Behavior: Behavior normal.       Assessment & Plan:   Problem List Items Addressed This Visit       Cardiovascular and Mediastinum   Essential hypertension   Relevant Medications   amLODipine (NORVASC) 5 MG tablet   pravastatin (PRAVACHOL) 20 MG tablet   valsartan (DIOVAN) 160 MG tablet   Other Relevant Orders   CBC with Differential/Platelet   CMP14+EGFR   Lipid panel   Bayer DCA Hb A1c Waived     Digestive   GERD (gastroesophageal reflux disease)   Relevant Medications   pantoprazole (PROTONIX) 40 MG tablet     Endocrine   Type 2 diabetes mellitus with diabetic neuropathy, without long-term current use of insulin ([facility]) - Primary   Relevant Medications   pravastatin (PRAVACHOL) 20 MG tablet   valsartan (DIOVAN) 160 MG tablet   Other Relevant Orders   CBC with Differential/Platelet   CMP14+EGFR   Lipid panel   Bayer DCA Hb A1c Waived     Other   Hyperlipidemia LDL goal <130   Relevant Medications   amLODipine (NORVASC) 5 MG tablet   pravastatin (PRAVACHOL) 20 MG tablet   valsartan (DIOVAN) 160 MG tablet   Other Relevant Orders   CBC with Differential/Platelet   CMP14+EGFR   Lipid panel   Bayer DCA Hb A1c Waived   Other Visit Diagnoses     Onychomycosis       Relevant Medications   ciclopirox  (PENLAC) 8 % solution       Continue current medicine, A1c slightly up, discussed diet.  Did prescribe her liquid that she can use for her toenails for the fungus.   Follow up plan: Return in about 3 months (around 11/02/2022), or if symptoms worsen or fail to improve, for Diabetes hypertension cholesterol.  Counseling provided for all of the vaccine components Orders Placed This Encounter  Procedures   CBC with Differential/Platelet   CMP14+EGFR   Lipid panel  Bayer [facility] Hb A1c [place], MD [facility] 08/02/2022, 10:56 AM

## 2022-08-03 LAB — CMP14+EGFR
ALT: 13 IU/L (ref 0–32)
AST: 18 IU/L (ref 0–40)
Albumin/Globulin Ratio: 1.4 (ref 1.2–2.2)
Albumin: 4.2 g/dL (ref 3.8–4.9)
Alkaline Phosphatase: 98 IU/L (ref 44–121)
BUN/Creatinine Ratio: 14 (ref 9–23)
BUN: 9 mg/dL (ref 6–24)
Bilirubin Total: 0.4 mg/dL (ref 0.0–1.2)
CO2: 23 mmol/L (ref 20–29)
Calcium: 9.8 mg/dL (ref 8.7–10.2)
Chloride: 103 mmol/L (ref 96–106)
Creatinine, Ser: 0.64 mg/dL (ref 0.57–1.00)
Globulin, Total: 2.9 g/dL (ref 1.5–4.5)
Glucose: 132 mg/dL — ABNORMAL HIGH (ref 70–99)
Potassium: 4.6 mmol/L (ref 3.5–5.2)
Sodium: 140 mmol/L (ref 134–144)
Total Protein: 7.1 g/dL (ref 6.0–8.5)
eGFR: 102 mL/min/{1.73_m2} (ref >59)

## 2022-08-03 LAB — LIPID PANEL
Chol/HDL Ratio: 2.3 ratio (ref 0.0–4.4)
Cholesterol, Total: 176 mg/dL (ref 100–199)
HDL: 78 mg/dL (ref >39)
LDL Chol Calc (NIH): 82 mg/dL (ref 0–99)
Triglycerides: 87 mg/dL (ref 0–149)
VLDL Cholesterol Cal: 16 mg/dL (ref 5–40)

## 2022-08-03 LAB — CBC WITH DIFFERENTIAL/PLATELET
Basophils Absolute: 0 10*3/uL (ref 0.0–0.2)
Basos: 1 %
EOS (ABSOLUTE): 0.2 10*3/uL (ref 0.0–0.4)
Eos: 4 %
Hematocrit: 40.8 % (ref 34.0–46.6)
Hemoglobin: 13.4 g/dL (ref 11.1–15.9)
Immature Grans (Abs): 0 10*3/uL (ref 0.0–0.1)
Immature Granulocytes: 0 %
Lymphocytes Absolute: 1.7 10*3/uL (ref 0.7–3.1)
Lymphs: 28 %
MCH: 28.6 pg (ref 26.6–33.0)
MCHC: 32.8 g/dL (ref 31.5–35.7)
MCV: 87 fL (ref 79–97)
Monocytes Absolute: 0.5 10*3/uL (ref 0.1–0.9)
Monocytes: 7 %
Neutrophils Absolute: 3.7 10*3/uL (ref 1.4–7.0)
Neutrophils: 60 %
Platelets: 245 10*3/uL (ref 150–450)
RBC: 4.69 x10E6/uL (ref 3.77–5.28)
RDW: 13.4 % (ref 11.7–15.4)
WBC: 6.1 10*3/uL (ref 3.4–10.8)

## 2022-09-24 ENCOUNTER — Encounter: Payer: Self-pay | Admitting: *Deleted

## 2022-10-14 ENCOUNTER — Other Ambulatory Visit: Payer: Self-pay | Admitting: Family Medicine

## 2022-10-14 DIAGNOSIS — I10 Essential (primary) hypertension: Secondary | ICD-10-CM

## 2022-10-21 ENCOUNTER — Other Ambulatory Visit: Payer: Self-pay | Admitting: Family Medicine

## 2022-10-21 DIAGNOSIS — K219 Gastro-esophageal reflux disease without esophagitis: Secondary | ICD-10-CM

## 2022-10-28 ENCOUNTER — Ambulatory Visit: Payer: BLUE CROSS/BLUE SHIELD | Admitting: Family Medicine

## 2022-10-28 ENCOUNTER — Encounter: Payer: Self-pay | Admitting: Family Medicine

## 2022-10-28 VITALS — BP 119/75 | HR 63 | Temp 98.7°F | Ht <= 58 in | Wt 121.0 lb

## 2022-10-28 DIAGNOSIS — I10 Essential (primary) hypertension: Secondary | ICD-10-CM

## 2022-10-28 DIAGNOSIS — E785 Hyperlipidemia, unspecified: Secondary | ICD-10-CM | POA: Diagnosis not present

## 2022-10-28 DIAGNOSIS — E1140 Type 2 diabetes mellitus with diabetic neuropathy, unspecified: Secondary | ICD-10-CM | POA: Diagnosis not present

## 2022-10-28 LAB — BAYER DCA HB A1C WAIVED: HB A1C (BAYER DCA - WAIVED): 6.7 % — ABNORMAL HIGH (ref 4.8–5.6)

## 2022-10-28 NOTE — Progress Notes (Signed)
BP 119/75   Pulse 63   Temp 98.7 F (37.1 C)   Ht 4' 9" (1.448 m)   Wt 121 lb (54.9 kg)   BMI 26.18 kg/m    Subjective:   Patient ID: Amy Mccarty, female    DOB: 01/04/1961, 61 y.o.   MRN: [id number]  HPI: Amy Mccarty is a 61 y.o. female presenting on 10/28/2022 for Medical Management of Chronic Issues   HPI Type 2 diabetes mellitus Patient comes in today for recheck of his diabetes. Patient has been currently taking no medicine currently, diet control. Patient is currently on an ACE inhibitor/ARB. Patient has not seen an ophthalmologist this year. Patient denies any issues with their feet. The symptom started onset as an adult hypertension and hyperlipidemia ARE RELATED TO DM   Hypertension Patient is currently on amlodipine and valsartan, and their blood pressure today is 119/75. Patient denies any lightheadedness or dizziness. Patient denies headaches, blurred vision, chest pains, shortness of breath, or weakness. Denies any side effects from medication and is content with current medication.   Hyperlipidemia Patient is coming in for recheck of his hyperlipidemia. The patient is currently taking pravastatin. They deny any issues with myalgias or history of liver damage from it. They deny any focal numbness or weakness or chest pain.   Relevant past medical, surgical, family and social history reviewed and updated as indicated. Interim medical history since our last visit reviewed. Allergies and medications reviewed and updated.  Review of Systems  Constitutional:  Negative for chills and fever.  Eyes:  Negative for visual disturbance.  Respiratory:  Negative for chest tightness and shortness of breath.   Cardiovascular:  Negative for chest pain and leg swelling.  Genitourinary:  Negative for difficulty urinating and dysuria.  Musculoskeletal:  Negative for back pain and gait problem.  Skin:  Negative for rash.  Neurological:  Negative for dizziness, light-headedness  and headaches.  Psychiatric/Behavioral:  Negative for agitation and behavioral problems.   All other systems reviewed and are negative.   Per HPI unless specifically indicated above   Allergies as of 10/28/2022       Reactions   Ibuprofen Rash   Naproxen Rash        Medication List        Accurate as of October 28, 2022 11:18 AM. If you have any questions, ask your nurse or doctor.          STOP taking these medications    cyclobenzaprine 5 MG tablet Commonly known as: FLEXERIL Stopped by: Fransisca Kaufmann Demarko Zeimet, MD   metroNIDAZOLE 500 MG tablet Commonly known as: FLAGYL Stopped by: Fransisca Kaufmann Caitlin Ainley, MD   predniSONE 20 MG tablet Commonly known as: DELTASONE Stopped by: Fransisca Kaufmann Zandyr Barnhill, MD       TAKE these medications    amLODipine 5 MG tablet Commonly known as: NORVASC TAKE 1 TABLET(5 MG) BY MOUTH DAILY   ciclopirox 8 % solution Commonly known as: PENLAC Apply topically at bedtime. Apply over nail and surrounding skin. Apply daily over previous coat. After seven (7) days, may remove with alcohol and continue cycle.   folic acid 1 MG tablet Commonly known as: FOLVITE Take 1 mg by mouth daily.   pantoprazole 40 MG tablet Commonly known as: PROTONIX TAKE 1 TABLET(40 MG) BY MOUTH DAILY   pravastatin 20 MG tablet Commonly known as: PRAVACHOL Take 1 tablet (20 mg total) by mouth daily.   valsartan 160 MG tablet Commonly known as: DIOVAN Take 1 tablet (  160 mg total) by mouth daily.         Objective:   BP 119/75   Pulse 63   Temp 98.7 F (37.1 C)   Ht 4' 9" (1.448 m)   Wt 121 lb (54.9 kg)   BMI 26.18 kg/m   Wt Readings from Last 3 Encounters:  10/28/22 121 lb (54.9 kg)  08/02/22 123 lb (55.8 kg)  04/29/22 124 lb (56.2 kg)    Physical Exam Vitals and nursing note reviewed.  Constitutional:      General: She is not in acute distress.    Appearance: She is well-developed. She is not diaphoretic.  Eyes:     Conjunctiva/sclera:  Conjunctivae normal.  Cardiovascular:     Rate and Rhythm: Normal rate and regular rhythm.     Heart sounds: Normal heart sounds. No murmur heard. Pulmonary:     Effort: Pulmonary effort is normal. No respiratory distress.     Breath sounds: Normal breath sounds. No wheezing.  Musculoskeletal:        General: No swelling or tenderness. Normal range of motion.  Skin:    General: Skin is warm and dry.     Findings: No rash.  Neurological:     Mental Status: She is alert and oriented to person, place, and time.     Coordination: Coordination normal.  Psychiatric:        Behavior: Behavior normal.       Assessment & Plan:   Problem List Items Addressed This Visit       Cardiovascular and Mediastinum   Essential hypertension     Endocrine   Type 2 diabetes mellitus with diabetic neuropathy, without long-term current use of insulin ([place]) - Primary   Relevant Orders   Bayer DCA Hb A1c Waived   CMP14+EGFR   Microalbumin / creatinine urine ratio     Other   Hyperlipidemia LDL goal <130    A1c is 6.7.  Slightly better than last time, continue to focus with diet. Follow up plan: Return in about 3 months (around 01/28/2023), or if symptoms worsen or fail to improve, for Diabetes recheck.  Counseling provided for all of the vaccine components Orders Placed This Encounter  Procedures   Bayer DCA Hb A1c Waived   CMP14+EGFR   Microalbumin / creatinine urine ratio    Caryl Pina, MD [facility] 10/28/2022, 11:18 AM

## 2022-10-29 LAB — CMP14+EGFR
ALT: 20 IU/L (ref 0–32)
AST: 21 IU/L (ref 0–40)
Albumin/Globulin Ratio: 1.8 (ref 1.2–2.2)
Albumin: 4.4 g/dL (ref 3.8–4.9)
Alkaline Phosphatase: 90 IU/L (ref 44–121)
BUN/Creatinine Ratio: 15 (ref 9–23)
BUN: 11 mg/dL (ref 6–24)
Bilirubin Total: 0.4 mg/dL (ref 0.0–1.2)
CO2: 25 mmol/L (ref 20–29)
Calcium: 9.3 mg/dL (ref 8.7–10.2)
Chloride: 101 mmol/L (ref 96–106)
Creatinine, Ser: 0.71 mg/dL (ref 0.57–1.00)
Globulin, Total: 2.5 g/dL (ref 1.5–4.5)
Glucose: 130 mg/dL — ABNORMAL HIGH (ref 70–99)
Potassium: 4.4 mmol/L (ref 3.5–5.2)
Sodium: 139 mmol/L (ref 134–144)
Total Protein: 6.9 g/dL (ref 6.0–8.5)
eGFR: 98 mL/min/{1.73_m2} (ref >59)

## 2022-10-29 LAB — MICROALBUMIN / CREATININE URINE RATIO
Creatinine, Urine: 24.6 mg/dL
Microalb/Creat Ratio: <12 mg/g creat (ref 0–29)
Microalbumin, Urine: <3 ug/mL

## 2022-10-31 ENCOUNTER — Ambulatory Visit: Payer: BLUE CROSS/BLUE SHIELD | Admitting: Family Medicine

## 2022-10-31 ENCOUNTER — Ambulatory Visit (INDEPENDENT_AMBULATORY_CARE_PROVIDER_SITE_OTHER): Payer: BLUE CROSS/BLUE SHIELD | Admitting: Family Medicine

## 2022-10-31 ENCOUNTER — Encounter: Payer: Self-pay | Admitting: Family Medicine

## 2022-10-31 DIAGNOSIS — N3000 Acute cystitis without hematuria: Secondary | ICD-10-CM

## 2022-10-31 MED ORDER — NITROFURANTOIN MONOHYD MACRO 100 MG PO CAPS: 100.0000 mg | ORAL_CAPSULE | Freq: Two times a day (BID) | ORAL | 0 refills | Status: AC

## 2022-10-31 NOTE — Progress Notes (Signed)
   Virtual Visit  Note Due to COVID-19 pandemic this visit was conducted virtually. This visit type was conducted due to national recommendations for restrictions regarding the COVID-19 Pandemic (e.g. social distancing, sheltering in place) in an effort to limit this patient's exposure and mitigate transmission in our community. All issues noted in this document were discussed and addressed.  A physical exam was not performed with this format.  I connected with Amy Mccarty on 10/31/22 at 1135 by telephone and verified that I am speaking with the correct person using two identifiers. Amy Mccarty is currently located at home and no one is currently with her during the visit. The provider, Gwenlyn Perking, FNP is located in their office at time of visit.  I discussed the limitations, risks, security and privacy concerns of performing an evaluation and management service by telephone and the availability of in person appointments. I also discussed with the patient that there may be a patient responsible charge related to this service. The patient expressed understanding and agreed to proceed.  CC: UTI symptoms  History and Present Illness:  HPI Amy Mccarty reports lower abdominal pressure, urinary frequency and urgency for 2 days. She denies fever, chills, nausea, vomiting, flank pain, or dysuria. She reports that she did a home test that was positive for a UTI. She is unable to bring a specimen for testing to the office today.    ROS As per HPI.   Observations/Objective: Alert and oriented x 3. Able to speak in full sentences without difficulty.   Assessment and Plan: Amy Mccarty was seen today for urinary tract infection.  Diagnoses and all orders for this visit:  Acute cystitis without hematuria Unable to come by to leave specimen for testing. Will treat empirically based on symptoms for UTI. Return to office for new or worsening symptoms, or if symptoms persist.  -     nitrofurantoin,  macrocrystal-monohydrate, (MACROBID) 100 MG capsule; Take 1 capsule (100 mg total) by mouth 2 (two) times daily for 5 days.     Follow Up Instructions: As needed.     I discussed the assessment and treatment plan with the patient. The patient was provided an opportunity to ask questions and all were answered. The patient agreed with the plan and demonstrated an understanding of the instructions.   The patient was advised to call back or seek an in-person evaluation if the symptoms worsen or if the condition fails to improve as anticipated.  The above assessment and management plan was discussed with the patient. The patient verbalized understanding of and has agreed to the management plan. Patient is aware to call the clinic if symptoms persist or worsen. Patient is aware when to return to the clinic for a follow-up visit. Patient educated on when it is appropriate to go to the emergency department.   Time call ended:  1346  I provided 11 minutes of  non face-to-face time during this encounter.    Gwenlyn Perking, FNP

## 2023-01-14 DIAGNOSIS — R1032 Left lower quadrant pain: Secondary | ICD-10-CM | POA: Diagnosis not present

## 2023-01-14 DIAGNOSIS — N83202 Unspecified ovarian cyst, left side: Secondary | ICD-10-CM | POA: Diagnosis not present

## 2023-01-30 ENCOUNTER — Ambulatory Visit: Payer: BLUE CROSS/BLUE SHIELD | Admitting: Family Medicine

## 2023-01-30 ENCOUNTER — Encounter: Payer: Self-pay | Admitting: Family Medicine

## 2023-01-30 DIAGNOSIS — E1140 Type 2 diabetes mellitus with diabetic neuropathy, unspecified: Secondary | ICD-10-CM

## 2023-01-30 DIAGNOSIS — E1159 Type 2 diabetes mellitus with other circulatory complications: Secondary | ICD-10-CM | POA: Diagnosis not present

## 2023-01-30 DIAGNOSIS — K219 Gastro-esophageal reflux disease without esophagitis: Secondary | ICD-10-CM

## 2023-01-30 DIAGNOSIS — I10 Essential (primary) hypertension: Secondary | ICD-10-CM | POA: Diagnosis not present

## 2023-01-30 DIAGNOSIS — I152 Hypertension secondary to endocrine disorders: Secondary | ICD-10-CM

## 2023-01-30 DIAGNOSIS — E1169 Type 2 diabetes mellitus with other specified complication: Secondary | ICD-10-CM

## 2023-01-30 DIAGNOSIS — E785 Hyperlipidemia, unspecified: Secondary | ICD-10-CM | POA: Diagnosis not present

## 2023-01-30 LAB — CMP14+EGFR
ALT: 14 IU/L (ref 0–32)
AST: 18 IU/L (ref 0–40)
Albumin/Globulin Ratio: 1.9 (ref 1.2–2.2)
Albumin: 4.3 g/dL (ref 3.8–4.9)
Alkaline Phosphatase: 79 IU/L (ref 44–121)
BUN/Creatinine Ratio: 13 (ref 9–23)
BUN: 9 mg/dL (ref 6–24)
Bilirubin Total: 0.4 mg/dL (ref 0.0–1.2)
CO2: 23 mmol/L (ref 20–29)
Calcium: 9.3 mg/dL (ref 8.7–10.2)
Chloride: 104 mmol/L (ref 96–106)
Creatinine, Ser: 0.68 mg/dL (ref 0.57–1.00)
Globulin, Total: 2.3 g/dL (ref 1.5–4.5)
Glucose: 123 mg/dL — ABNORMAL HIGH (ref 70–99)
Potassium: 4.5 mmol/L (ref 3.5–5.2)
Sodium: 140 mmol/L (ref 134–144)
Total Protein: 6.6 g/dL (ref 6.0–8.5)
eGFR: 100 mL/min/{1.73_m2} (ref >59)

## 2023-01-30 LAB — CBC WITH DIFFERENTIAL/PLATELET
Basophils Absolute: 0 10*3/uL (ref 0.0–0.2)
Basos: 1 %
EOS (ABSOLUTE): 0.1 10*3/uL (ref 0.0–0.4)
Eos: 2 %
Hematocrit: 38.9 % (ref 34.0–46.6)
Hemoglobin: 12.7 g/dL (ref 11.1–15.9)
Immature Grans (Abs): 0 10*3/uL (ref 0.0–0.1)
Immature Granulocytes: 0 %
Lymphocytes Absolute: 1.7 10*3/uL (ref 0.7–3.1)
Lymphs: 29 %
MCH: 28.1 pg (ref 26.6–33.0)
MCHC: 32.6 g/dL (ref 31.5–35.7)
MCV: 86 fL (ref 79–97)
Monocytes Absolute: 0.4 10*3/uL (ref 0.1–0.9)
Monocytes: 6 %
Neutrophils Absolute: 3.6 10*3/uL (ref 1.4–7.0)
Neutrophils: 62 %
Platelets: 242 10*3/uL (ref 150–450)
RBC: 4.52 x10E6/uL (ref 3.77–5.28)
RDW: 13.2 % (ref 11.7–15.4)
WBC: 5.7 10*3/uL (ref 3.4–10.8)

## 2023-01-30 LAB — LIPID PANEL
Chol/HDL Ratio: 2.1 ratio (ref 0.0–4.4)
Cholesterol, Total: 163 mg/dL (ref 100–199)
HDL: 77 mg/dL (ref >39)
LDL Chol Calc (NIH): 76 mg/dL (ref 0–99)
Triglycerides: 50 mg/dL (ref 0–149)
VLDL Cholesterol Cal: 10 mg/dL (ref 5–40)

## 2023-01-30 LAB — BAYER DCA HB A1C WAIVED: HB A1C (BAYER DCA - WAIVED): 6.8 % — ABNORMAL HIGH (ref 4.8–5.6)

## 2023-01-30 MED ORDER — PANTOPRAZOLE SODIUM 40 MG PO TBEC: 40.0000 mg | DELAYED_RELEASE_TABLET | Freq: Two times a day (BID) | ORAL | 1 refills | Status: DC

## 2023-01-30 NOTE — Progress Notes (Signed)
There were no vitals taken for this visit.   Subjective:   Patient ID: Amy Mccarty, female    DOB: [birth year], 62 y.o.   MRN: [id number]  HPI: Amy Mccarty is a 62 y.o. female presenting on 01/30/2023 for No chief complaint on file.   HPI Type 2 diabetes mellitus Patient comes in today for recheck of his diabetes. Patient has been currently taking diet control. Patient is currently on an ACE inhibitor/ARB. Patient has not seen an ophthalmologist this year. Patient denies any issues with their feet. The symptom started onset as an adult hypertension and hyperlipidemia ARE RELATED TO DM   Hypertension Patient is currently on amlodipine and valsartan, and their blood pressure today is 138/85. Patient denies any lightheadedness or dizziness. Patient denies headaches, blurred vision, chest pains, shortness of breath, or weakness. Denies any side effects from medication and is content with current medication.   Hyperlipidemia Patient is coming in for recheck of his hyperlipidemia. The patient is currently taking pravastatin. They deny any issues with myalgias or history of liver damage from it. They deny any focal numbness or weakness or chest pain.   Patient is complaining that she has been losing some weight and she feels like she is down about 5 pounds and she has not been trying.  She does not know why she is down and was concerned about that.  After asking multiple questions, she is not sleeping well for 1.  Her husband's snoring has affected her.  She also admits that she has been having a lot more indigestion and feels like she needs to burp of gas but then she can actually burp up and that has been affecting her as well.  She says is been going on for 5 or 6 months and she thinks that is been about the time that she is lost that weight.  Relevant past medical, surgical, family and social history reviewed and updated as indicated. Interim medical history since our last visit  reviewed. Allergies and medications reviewed and updated.  Review of Systems  Constitutional:  Negative for chills and fever.  Eyes:  Negative for visual disturbance.  Respiratory:  Negative for chest tightness and shortness of breath.   Cardiovascular:  Negative for chest pain and leg swelling.  Gastrointestinal:  Positive for abdominal pain and nausea. Negative for diarrhea and vomiting.  Genitourinary:  Negative for difficulty urinating and dysuria.  Musculoskeletal:  Negative for back pain and gait problem.  Skin:  Negative for rash.  Neurological:  Negative for light-headedness and headaches.  Psychiatric/Behavioral:  Negative for agitation and behavioral problems.   All other systems reviewed and are negative.   Per HPI unless specifically indicated above   Allergies as of 01/30/2023       Reactions   Ibuprofen Rash   Naproxen Rash        Medication List        Accurate as of January 30, 2023 11:28 AM. If you have any questions, ask your nurse or doctor.          amLODipine 5 MG tablet Commonly known as: NORVASC TAKE 1 TABLET(5 MG) BY MOUTH DAILY   ciclopirox 8 % solution Commonly known as: PENLAC Apply topically at bedtime. Apply over nail and surrounding skin. Apply daily over previous coat. After seven (7) days, may remove with alcohol and continue cycle.   folic acid 1 MG tablet Commonly known as: FOLVITE Take 1 mg by mouth daily.   pantoprazole 40 MG  tablet Commonly known as: PROTONIX TAKE 1 TABLET(40 MG) BY MOUTH DAILY   pravastatin 20 MG tablet Commonly known as: PRAVACHOL Take 1 tablet (20 mg total) by mouth daily.   valsartan 160 MG tablet Commonly known as: DIOVAN Take 1 tablet (160 mg total) by mouth daily.         Objective:   There were no vitals taken for this visit.  Wt Readings from Last 3 Encounters:  10/28/22 121 lb (54.9 kg)  08/02/22 123 lb (55.8 kg)  04/29/22 124 lb (56.2 kg)    Physical Exam Vitals and nursing note  reviewed.  Constitutional:      General: She is not in acute distress.    Appearance: She is well-developed. She is not diaphoretic.  Eyes:     Conjunctiva/sclera: Conjunctivae normal.  Cardiovascular:     Rate and Rhythm: Normal rate and regular rhythm.     Heart sounds: Normal heart sounds. No murmur heard. Pulmonary:     Effort: Pulmonary effort is normal. No respiratory distress.     Breath sounds: Normal breath sounds. No wheezing.  Abdominal:     General: Abdomen is flat. Bowel sounds are normal. There is no distension.     Palpations: Abdomen is soft.     Tenderness: There is abdominal tenderness in the epigastric area. There is no right CVA tenderness, left CVA tenderness, guarding or rebound.     Hernia: No hernia is present.  Musculoskeletal:        General: No tenderness. Normal range of motion.  Skin:    General: Skin is warm and dry.     Findings: No rash.  Neurological:     Mental Status: She is alert and oriented to person, place, and time.     Coordination: Coordination normal.  Psychiatric:        Behavior: Behavior normal.       Assessment & Plan:   Problem List Items Addressed This Visit       Cardiovascular and Mediastinum   Essential hypertension   Relevant Orders   CBC with Differential/Platelet   CMP14+EGFR   Lipid panel   Bayer DCA Hb A1c Waived     Endocrine   Type 2 diabetes mellitus with diabetic neuropathy, without long-term current use of insulin (HCC) - Primary   Relevant Orders   CBC with Differential/Platelet   CMP14+EGFR   Lipid panel   Bayer DCA Hb A1c Waived     Other   Hyperlipidemia LDL goal <130   Relevant Orders   CBC with Differential/Platelet   CMP14+EGFR   Lipid panel   Bayer DCA Hb A1c Waived  Will increase her Protonix to twice a day and see if that helps calm her stomach down and helps with the weight loss.  A1c is up slightly at 6.8.  No other changes but just focus on diet.  Follow up plan: Return in about  3 months (around 04/30/2023), or if symptoms worsen or fail to improve, for Diabetes and hypertension recheck.  Counseling provided for all of the vaccine components Orders Placed This Encounter  Procedures   CBC with Differential/Platelet   CMP14+EGFR   Lipid panel   Bayer DCA Hb A1c Waived    Caryl Pina, MD [facility] 01/30/2023, 11:28 AM

## 2023-02-10 ENCOUNTER — Encounter: Payer: Self-pay | Admitting: Family Medicine

## 2023-02-10 DIAGNOSIS — Z1211 Encounter for screening for malignant neoplasm of colon: Secondary | ICD-10-CM

## 2023-03-17 DIAGNOSIS — Z1211 Encounter for screening for malignant neoplasm of colon: Secondary | ICD-10-CM | POA: Diagnosis not present

## 2023-03-23 LAB — COLOGUARD: COLOGUARD: NEGATIVE

## 2023-05-01 ENCOUNTER — Ambulatory Visit: Payer: BLUE CROSS/BLUE SHIELD | Admitting: Family Medicine

## 2023-05-02 DIAGNOSIS — Z01419 Encounter for gynecological examination (general) (routine) without abnormal findings: Secondary | ICD-10-CM | POA: Diagnosis not present

## 2023-05-02 DIAGNOSIS — Z124 Encounter for screening for malignant neoplasm of cervix: Secondary | ICD-10-CM | POA: Diagnosis not present

## 2023-05-02 DIAGNOSIS — Z1231 Encounter for screening mammogram for malignant neoplasm of breast: Secondary | ICD-10-CM | POA: Diagnosis not present

## 2023-07-09 ENCOUNTER — Encounter: Payer: Self-pay | Admitting: Family Medicine

## 2023-07-09 ENCOUNTER — Ambulatory Visit: Payer: BLUE CROSS/BLUE SHIELD | Admitting: Family Medicine

## 2023-07-09 VITALS — BP 136/79 | HR 58 | Ht <= 58 in | Wt 123.0 lb

## 2023-07-09 DIAGNOSIS — E1169 Type 2 diabetes mellitus with other specified complication: Secondary | ICD-10-CM | POA: Diagnosis not present

## 2023-07-09 DIAGNOSIS — E785 Hyperlipidemia, unspecified: Secondary | ICD-10-CM

## 2023-07-09 DIAGNOSIS — I10 Essential (primary) hypertension: Secondary | ICD-10-CM | POA: Diagnosis not present

## 2023-07-09 DIAGNOSIS — I152 Hypertension secondary to endocrine disorders: Secondary | ICD-10-CM | POA: Diagnosis not present

## 2023-07-09 DIAGNOSIS — E1159 Type 2 diabetes mellitus with other circulatory complications: Secondary | ICD-10-CM | POA: Diagnosis not present

## 2023-07-09 DIAGNOSIS — K219 Gastro-esophageal reflux disease without esophagitis: Secondary | ICD-10-CM

## 2023-07-09 DIAGNOSIS — E1140 Type 2 diabetes mellitus with diabetic neuropathy, unspecified: Secondary | ICD-10-CM | POA: Diagnosis not present

## 2023-07-09 DIAGNOSIS — B351 Tinea unguium: Secondary | ICD-10-CM

## 2023-07-09 LAB — BAYER DCA HB A1C WAIVED: HB A1C (BAYER DCA - WAIVED): 7 % — ABNORMAL HIGH (ref 4.8–5.6)

## 2023-07-09 MED ORDER — PRAVASTATIN SODIUM 20 MG PO TABS: 20.0000 mg | ORAL_TABLET | Freq: Every day | ORAL | 3 refills | Status: DC

## 2023-07-09 MED ORDER — AMLODIPINE BESYLATE 5 MG PO TABS: ORAL_TABLET | ORAL | 11 refills | Status: DC

## 2023-07-09 MED ORDER — CICLOPIROX 8 % EX SOLN: Freq: Every day | CUTANEOUS | 1 refills | Status: DC

## 2023-07-09 MED ORDER — VALSARTAN 160 MG PO TABS: 160.0000 mg | ORAL_TABLET | Freq: Every day | ORAL | 3 refills | Status: DC

## 2023-07-09 MED ORDER — PANTOPRAZOLE SODIUM 40 MG PO TBEC: 40.0000 mg | DELAYED_RELEASE_TABLET | Freq: Two times a day (BID) | ORAL | 3 refills | Status: DC

## 2023-07-09 NOTE — Progress Notes (Signed)
BP 136/79   Pulse (!) 58   Ht 4\' 9"  (1.448 m)   Wt 123 lb (55.8 kg)   SpO2 100%   BMI 26.62 kg/m    Subjective:   Patient ID: Amy Mccarty, female    DOB: 1961-12-04, 62 y.o.   MRN: [id number]  HPI: Amy Mccarty is a 62 y.o. female presenting on 07/09/2023 for Medical Management of Chronic Issues and Diabetes   HPI Type 2 diabetes mellitus Patient comes in today for recheck of his diabetes. Patient has been currently taking no medicine, diet control. Patient is currently on an ACE inhibitor/ARB. Patient has not seen an ophthalmologist this year. Patient denies any new issues with their feet. The symptom started onset as an adult hypertension and hyperlipidemia ARE RELATED TO DM   Hyperlipidemia Patient is coming in for recheck of his hyperlipidemia. The patient is currently taking pravastatin. They deny any issues with myalgias or history of liver damage from it. They deny any focal numbness or weakness or chest pain.   Hypertension Patient is currently on amlodipine and valsartan, and their blood pressure today is 136/79. Patient denies any lightheadedness or dizziness. Patient denies headaches, blurred vision, chest pains, shortness of breath, or weakness. Denies any side effects from medication and is content with current medication.   GERD Patient is currently on pantoprazole.  She denies any major symptoms or abdominal pain or belching or burping. She denies any blood in her stool or lightheadedness or dizziness.   Relevant past medical, surgical, family and social history reviewed and updated as indicated. Interim medical history since our last visit reviewed. Allergies and medications reviewed and updated.  Review of Systems  Constitutional:  Negative for chills and fever.  HENT:  Negative for congestion, ear discharge and ear pain.   Eyes:  Negative for redness and visual disturbance.  Respiratory:  Negative for chest tightness and shortness of breath.    Cardiovascular:  Negative for chest pain and leg swelling.  Genitourinary:  Negative for difficulty urinating and dysuria.  Musculoskeletal:  Negative for back pain and gait problem.  Skin:  Negative for rash.  Neurological:  Negative for light-headedness and headaches.  Psychiatric/Behavioral:  Negative for agitation and behavioral problems.   All other systems reviewed and are negative.   Per HPI unless specifically indicated above   Allergies as of 07/09/2023       Reactions   Ibuprofen Rash   Naproxen Rash        Medication List        Accurate as of July 09, 2023 11:29 AM. If you have any questions, ask your nurse or doctor.          amLODipine 5 MG tablet Commonly known as: NORVASC TAKE 1 TABLET(5 MG) BY MOUTH DAILY   ciclopirox 8 % solution Commonly known as: PENLAC Apply topically at bedtime. Apply over nail and surrounding skin. Apply daily over previous coat. After seven (7) days, may remove with alcohol and continue cycle.   folic acid 1 MG tablet Commonly known as: FOLVITE Take 1 mg by mouth daily.   pantoprazole 40 MG tablet Commonly known as: PROTONIX Take 1 tablet (40 mg total) by mouth 2 (two) times daily before a meal.   pravastatin 20 MG tablet Commonly known as: PRAVACHOL Take 1 tablet (20 mg total) by mouth daily.   valsartan 160 MG tablet Commonly known as: DIOVAN Take 1 tablet (160 mg total) by mouth daily.  Objective:   BP 136/79   Pulse (!) 58   Ht 4\' 9"  (1.448 m)   Wt 123 lb (55.8 kg)   SpO2 100%   BMI 26.62 kg/m   Wt Readings from Last 3 Encounters:  07/09/23 123 lb (55.8 kg)  10/28/22 121 lb (54.9 kg)  08/02/22 123 lb (55.8 kg)    Physical Exam Vitals and nursing note reviewed.  Constitutional:      General: She is not in acute distress.    Appearance: She is well-developed. She is not diaphoretic.  Eyes:     Conjunctiva/sclera: Conjunctivae normal.  Cardiovascular:     Rate and Rhythm: Normal rate  and regular rhythm.     Heart sounds: Normal heart sounds. No murmur heard. Pulmonary:     Effort: Pulmonary effort is normal. No respiratory distress.     Breath sounds: Normal breath sounds. No wheezing.  Musculoskeletal:        General: No tenderness. Normal range of motion.  Skin:    General: Skin is warm and dry.     Findings: No rash.  Neurological:     Mental Status: She is alert and oriented to person, place, and time.     Coordination: Coordination normal.  Psychiatric:        Behavior: Behavior normal.       Assessment & Plan:   Problem List Items Addressed This Visit       Cardiovascular and Mediastinum   Essential hypertension   Relevant Medications   amLODipine (NORVASC) 5 MG tablet   pravastatin (PRAVACHOL) 20 MG tablet   valsartan (DIOVAN) 160 MG tablet   Other Relevant Orders   CBC with Differential/Platelet   CMP14+EGFR   Lipid panel   Bayer DCA Hb A1c Waived     Digestive   GERD (gastroesophageal reflux disease)   Relevant Medications   pantoprazole (PROTONIX) 40 MG tablet     Endocrine   Type 2 diabetes mellitus with diabetic neuropathy, without long-term current use of insulin (HCC) - Primary   Relevant Medications   pravastatin (PRAVACHOL) 20 MG tablet   valsartan (DIOVAN) 160 MG tablet   Other Relevant Orders   CBC with Differential/Platelet   CMP14+EGFR   Lipid panel   Bayer DCA Hb A1c Waived     Other   Hyperlipidemia LDL goal <130   Relevant Medications   amLODipine (NORVASC) 5 MG tablet   pravastatin (PRAVACHOL) 20 MG tablet   valsartan (DIOVAN) 160 MG tablet   Other Relevant Orders   CBC with Differential/Platelet   CMP14+EGFR   Lipid panel   Bayer DCA Hb A1c Waived   Other Visit Diagnoses     Onychomycosis       Relevant Medications   ciclopirox (PENLAC) 8 % solution       Continue current medicine, no changes.  A1c is up slightly at 7.0 and she is going to focus on diet. Follow up plan: Return in about 3 months  (around 10/09/2023), or if symptoms worsen or fail to improve, for Diabetes and hypertension and cholesterol.  Counseling provided for all of the vaccine components Orders Placed This Encounter  Procedures   CBC with Differential/Platelet   CMP14+EGFR   Lipid panel   Bayer DCA Hb A1c Waived    Arville Care, MD [facility] 07/09/2023, 11:29 AM

## 2023-07-10 ENCOUNTER — Other Ambulatory Visit: Payer: Self-pay | Admitting: Family Medicine

## 2023-07-10 DIAGNOSIS — I10 Essential (primary) hypertension: Secondary | ICD-10-CM

## 2023-07-10 LAB — CMP14+EGFR
ALT: 18 IU/L (ref 0–32)
AST: 16 IU/L (ref 0–40)
Albumin: 4.3 g/dL (ref 3.8–4.9)
Alkaline Phosphatase: 97 IU/L (ref 44–121)
BUN/Creatinine Ratio: 16 (ref 9–23)
BUN: 11 mg/dL (ref 6–24)
Bilirubin Total: 0.3 mg/dL (ref 0.0–1.2)
CO2: 23 mmol/L (ref 20–29)
Calcium: 9.3 mg/dL (ref 8.7–10.2)
Chloride: 104 mmol/L (ref 96–106)
Creatinine, Ser: 0.7 mg/dL (ref 0.57–1.00)
Globulin, Total: 2.4 g/dL (ref 1.5–4.5)
Glucose: 139 mg/dL — ABNORMAL HIGH (ref 70–99)
Potassium: 4.2 mmol/L (ref 3.5–5.2)
Sodium: 141 mmol/L (ref 134–144)
Total Protein: 6.7 g/dL (ref 6.0–8.5)
eGFR: 100 mL/min/{1.73_m2} (ref >59)

## 2023-07-10 LAB — CBC WITH DIFFERENTIAL/PLATELET
Basophils Absolute: 0 10*3/uL (ref 0.0–0.2)
Basos: 0 %
EOS (ABSOLUTE): 0.2 10*3/uL (ref 0.0–0.4)
Eos: 3 %
Hematocrit: 41.5 % (ref 34.0–46.6)
Hemoglobin: 12.8 g/dL (ref 11.1–15.9)
Immature Grans (Abs): 0 10*3/uL (ref 0.0–0.1)
Immature Granulocytes: 0 %
Lymphocytes Absolute: 2 10*3/uL (ref 0.7–3.1)
Lymphs: 31 %
MCH: 28.3 pg (ref 26.6–33.0)
MCHC: 30.8 g/dL — ABNORMAL LOW (ref 31.5–35.7)
MCV: 92 fL (ref 79–97)
Monocytes Absolute: 0.5 10*3/uL (ref 0.1–0.9)
Monocytes: 8 %
Neutrophils Absolute: 3.8 10*3/uL (ref 1.4–7.0)
Neutrophils: 58 %
Platelets: 227 10*3/uL (ref 150–450)
RBC: 4.53 x10E6/uL (ref 3.77–5.28)
RDW: 13.4 % (ref 11.7–15.4)
WBC: 6.5 10*3/uL (ref 3.4–10.8)

## 2023-07-10 LAB — LIPID PANEL
Chol/HDL Ratio: 2 ratio (ref 0.0–4.4)
Cholesterol, Total: 178 mg/dL (ref 100–199)
HDL: 88 mg/dL (ref >39)
LDL Chol Calc (NIH): 81 mg/dL (ref 0–99)
Triglycerides: 43 mg/dL (ref 0–149)
VLDL Cholesterol Cal: 9 mg/dL (ref 5–40)

## 2023-08-05 ENCOUNTER — Other Ambulatory Visit: Payer: Self-pay | Admitting: Family Medicine

## 2023-09-10 DIAGNOSIS — H04123 Dry eye syndrome of bilateral lacrimal glands: Secondary | ICD-10-CM | POA: Diagnosis not present

## 2023-09-10 DIAGNOSIS — H40033 Anatomical narrow angle, bilateral: Secondary | ICD-10-CM | POA: Diagnosis not present

## 2023-10-04 ENCOUNTER — Other Ambulatory Visit: Payer: Self-pay | Admitting: Family Medicine

## 2023-10-04 DIAGNOSIS — K219 Gastro-esophageal reflux disease without esophagitis: Secondary | ICD-10-CM

## 2023-10-09 ENCOUNTER — Ambulatory Visit: Payer: BLUE CROSS/BLUE SHIELD | Admitting: Family Medicine

## 2023-10-09 ENCOUNTER — Encounter: Payer: Self-pay | Admitting: Family Medicine

## 2023-10-09 VITALS — BP 135/81 | HR 63 | Ht <= 58 in | Wt 121.0 lb

## 2023-10-09 DIAGNOSIS — I10 Essential (primary) hypertension: Secondary | ICD-10-CM

## 2023-10-09 DIAGNOSIS — E785 Hyperlipidemia, unspecified: Secondary | ICD-10-CM | POA: Diagnosis not present

## 2023-10-09 DIAGNOSIS — E1140 Type 2 diabetes mellitus with diabetic neuropathy, unspecified: Secondary | ICD-10-CM

## 2023-10-09 LAB — BAYER DCA HB A1C WAIVED: HB A1C (BAYER DCA - WAIVED): 7.2 % — ABNORMAL HIGH (ref 4.8–5.6)

## 2023-10-09 NOTE — Progress Notes (Signed)
BP 135/81   Pulse 63   Ht 4\' 9"  (1.448 m)   Wt 121 lb (54.9 kg)   SpO2 98%   BMI 26.18 kg/m    Subjective:   Patient ID: Amy Mccarty, female    DOB: November 13, 1961, 62 y.o.   MRN: [id number]  HPI: Amy Mccarty is a 62 y.o. female presenting on 10/09/2023 for Medical Management of Chronic Issues and Diabetes   HPI Type 2 diabetes mellitus Patient comes in today for recheck of his diabetes. Patient has been currently taking medicine currently, diet control. Patient is currently on an ACE inhibitor/ARB. Patient has seen an ophthalmologist this year. Patient denies any new issues with their feet. The symptom started onset as an adult hypertension and hyperlipidemia ARE RELATED TO DM   Hypertension Patient is currently on amlodipine and valsartan, and their blood pressure today is 135/81. Patient denies any lightheadedness or dizziness. Patient denies headaches, blurred vision, chest pains, shortness of breath, or weakness. Denies any side effects from medication and is content with current medication.   Hyperlipidemia Patient is coming in for recheck of his hyperlipidemia. The patient is currently taking pravastatin. They deny any issues with myalgias or history of liver damage from it. They deny any focal numbness or weakness or chest pain.   Relevant past medical, surgical, family and social history reviewed and updated as indicated. Interim medical history since our last visit reviewed. Allergies and medications reviewed and updated.  Review of Systems  Constitutional:  Negative for chills and fever.  Eyes:  Negative for redness and visual disturbance.  Respiratory:  Negative for chest tightness and shortness of breath.   Cardiovascular:  Negative for chest pain and leg swelling.  Gastrointestinal:  Positive for abdominal pain (acid reflux).  Genitourinary:  Negative for difficulty urinating and dysuria.  Musculoskeletal:  Negative for back pain and gait problem.  Skin:   Negative for rash.  Neurological:  Negative for dizziness, light-headedness and headaches.  Psychiatric/Behavioral:  Negative for agitation and behavioral problems.   All other systems reviewed and are negative.   Per HPI unless specifically indicated above   Allergies as of 10/09/2023       Reactions   Ibuprofen Rash   Naproxen Rash        Medication List        Accurate as of October 09, 2023 11:35 AM. If you have any questions, ask your nurse or doctor.          amLODipine 5 MG tablet Commonly known as: NORVASC TAKE 1 TABLET(5 MG) BY MOUTH DAILY   ciclopirox 8 % solution Commonly known as: PENLAC Apply topically at bedtime. Apply over nail and surrounding skin. Apply daily over previous coat. After seven (7) days, may remove with alcohol and continue cycle.   folic acid 1 MG tablet Commonly known as: FOLVITE Take 1 mg by mouth daily.   pantoprazole 40 MG tablet Commonly known as: PROTONIX TAKE 1 TABLET(40 MG) BY MOUTH DAILY   pravastatin 20 MG tablet Commonly known as: PRAVACHOL TAKE 1 TABLET(20 MG) BY MOUTH DAILY   valsartan 160 MG tablet Commonly known as: DIOVAN Take 1 tablet (160 mg total) by mouth daily.         Objective:   BP 135/81   Pulse 63   Ht 4\' 9"  (1.448 m)   Wt 121 lb (54.9 kg)   SpO2 98%   BMI 26.18 kg/m   Wt Readings from Last 3 Encounters:  10/09/23 121 lb (  54.9 kg)  07/09/23 123 lb (55.8 kg)  10/28/22 121 lb (54.9 kg)    Physical Exam Vitals and nursing note reviewed.  Constitutional:      General: She is not in acute distress.    Appearance: She is well-developed. She is not diaphoretic.  Eyes:     Conjunctiva/sclera: Conjunctivae normal.  Cardiovascular:     Rate and Rhythm: Normal rate and regular rhythm.     Heart sounds: Normal heart sounds. No murmur heard. Pulmonary:     Effort: Pulmonary effort is normal. No respiratory distress.     Breath sounds: Normal breath sounds. No wheezing.  Musculoskeletal:         General: No swelling. Normal range of motion.  Skin:    General: Skin is warm and dry.     Findings: No rash.  Neurological:     Mental Status: She is alert and oriented to person, place, and time.     Coordination: Coordination normal.  Psychiatric:        Behavior: Behavior normal.       Assessment & Plan:   Problem List Items Addressed This Visit       Cardiovascular and Mediastinum   Essential hypertension     Endocrine   Type 2 diabetes mellitus with diabetic neuropathy, without long-term current use of insulin (HCC) - Primary   Relevant Orders   Bayer DCA Hb A1c Waived   Microalbumin / creatinine urine ratio     Other   Hyperlipidemia LDL goal <130    A1c is 7.2.  No change in medication, focus on diet. Follow up plan: Return in about 3 months (around 01/09/2024), or if symptoms worsen or fail to improve, for Diabetes and hypertension.  Counseling provided for all of the vaccine components Orders Placed This Encounter  Procedures   Bayer DCA Hb A1c Waived   Microalbumin / creatinine urine ratio    Arville Care, MD [place] 10/09/2023, 11:35 AM

## 2023-10-10 LAB — MICROALBUMIN / CREATININE URINE RATIO
Creatinine, Urine: 52.1 mg/dL
Microalb/Creat Ratio: <6 mg/g{creat} (ref 0–29)
Microalbumin, Urine: <3 ug/mL

## 2023-11-05 ENCOUNTER — Telehealth: Payer: Self-pay | Admitting: Family Medicine

## 2023-11-05 ENCOUNTER — Ambulatory Visit: Payer: BLUE CROSS/BLUE SHIELD

## 2023-11-05 ENCOUNTER — Ambulatory Visit: Payer: BLUE CROSS/BLUE SHIELD | Admitting: Family Medicine

## 2023-11-05 ENCOUNTER — Encounter: Payer: Self-pay | Admitting: Family Medicine

## 2023-11-05 VITALS — BP 122/78 | HR 68 | Temp 97.9°F | Ht <= 58 in | Wt 118.2 lb

## 2023-11-05 DIAGNOSIS — M62830 Muscle spasm of back: Secondary | ICD-10-CM | POA: Diagnosis not present

## 2023-11-05 DIAGNOSIS — E1140 Type 2 diabetes mellitus with diabetic neuropathy, unspecified: Secondary | ICD-10-CM

## 2023-11-05 DIAGNOSIS — R350 Frequency of micturition: Secondary | ICD-10-CM | POA: Diagnosis not present

## 2023-11-05 DIAGNOSIS — M546 Pain in thoracic spine: Secondary | ICD-10-CM

## 2023-11-05 DIAGNOSIS — E785 Hyperlipidemia, unspecified: Secondary | ICD-10-CM

## 2023-11-05 DIAGNOSIS — J301 Allergic rhinitis due to pollen: Secondary | ICD-10-CM | POA: Diagnosis not present

## 2023-11-05 LAB — URINALYSIS, ROUTINE W REFLEX MICROSCOPIC
Bilirubin, UA: NEGATIVE
Glucose, UA: NEGATIVE
Ketones, UA: NEGATIVE
Leukocytes,UA: NEGATIVE
Nitrite, UA: NEGATIVE
Protein,UA: NEGATIVE
RBC, UA: NEGATIVE
Specific Gravity, UA: 1.025 (ref 1.005–1.030)
Urobilinogen, Ur: 0.2 mg/dL (ref 0.2–1.0)
pH, UA: 5.5 (ref 5.0–7.5)

## 2023-11-05 MED ORDER — CYCLOBENZAPRINE HCL 5 MG PO TABS: 5.0000 mg | ORAL_TABLET | Freq: Three times a day (TID) | ORAL | 0 refills | Status: DC | PRN

## 2023-11-05 MED ORDER — CETIRIZINE HCL 10 MG PO TABS
10.0000 mg | ORAL_TABLET | Freq: Every day | ORAL | 11 refills | Status: AC
Start: 2023-11-05 — End: ?

## 2023-11-05 NOTE — Progress Notes (Signed)
Subjective:  Patient ID: Amy Mccarty, female    DOB: Apr 18, 1961, 62 y.o.   MRN: [id number]  Patient Care Team: Dettinger, Elige Radon, MD as PCP - General (Family Medicine)   Chief Complaint:  Back Pain   HPI: Amy Mccarty is a 62 y.o. female presenting on 11/05/2023 for Back Pain   Discussed the use of AI scribe software for clinical note transcription with the patient, who gave verbal consent to proceed.  History of Present Illness   Patient presented with a week-long history of pressure in the upper back, radiating laterally and returning to a central location. The discomfort was described as a persistent sensation, with the patient waking up with pain in the same area. Concurrently, the patient reported increased frequency of urination. A home UTI test indicated a high pH level, but no discoloration was noted in the urine. The patient experienced chills the day prior to the consultation.  The patient's medical history includes diabetes, with recent fluctuations in blood glucose levels. The patient reported blood glucose readings ranging from the seventies to the eighties, with recent readings stabilizing around 160-155. The patient's last A1c, taken about a month prior, was 7.2, slightly above the goal range. The patient acknowledged a diet that includes a variety of foods, some of which may not be ideal for blood glucose control.  In addition to these symptoms, the patient reported waking up with a congested cough, producing greenish mucus. The patient's voice was noted to be raspy, which she attributed to seasonal changes.          Relevant past medical, surgical, family, and social history reviewed and updated as indicated.  Allergies and medications reviewed and updated. Data reviewed: Chart in Epic.   Past Medical History:  Diagnosis Date   GERD (gastroesophageal reflux disease)    Hyperlipidemia    Hypertension     Past Surgical History:  Procedure Laterality  Date   CESAREAN SECTION     COLPOSCOPY      Social History   Socioeconomic History   Marital status: Married    Spouse name: Not on file   Number of children: Not on file   Years of education: Not on file   Highest education level: Not on file  Occupational History   Not on file  Tobacco Use   Smoking status: Never   Smokeless tobacco: Never  Substance and Sexual Activity   Alcohol use: Yes    Comment: occ   Drug use: Never   Sexual activity: Not Currently    Comment: pain with intercourse with husband, will come back for pap  Other Topics Concern   Not on file  Social History Narrative   Not on file   Social Determinants of Health   Financial Resource Strain: Not on file  Food Insecurity: Not on file  Transportation Needs: Not on file  Physical Activity: Not on file  Stress: Not on file  Social Connections: Unknown (05/14/2022)   Received from [facility]   Social Network    Social Network: Not on file  Intimate Partner Violence: Unknown (04/05/2022)   Received from [facility], [facility]   HITS    Physically Hurt: Not on file    Insult or Talk Down To: Not on file    Threaten Physical Harm: Not on file    Scream or Curse: Not on file    Outpatient Encounter Medications as of 11/05/2023  Medication Sig   amLODipine (NORVASC)  5 MG tablet TAKE 1 TABLET(5 MG) BY MOUTH DAILY   cetirizine (ZYRTEC) 10 MG tablet Take 1 tablet (10 mg total) by mouth daily.   ciclopirox (PENLAC) 8 % solution Apply topically at bedtime. Apply over nail and surrounding skin. Apply daily over previous coat. After seven (7) days, may remove with alcohol and continue cycle.   cyclobenzaprine (FLEXERIL) 5 MG tablet Take 1 tablet (5 mg total) by mouth 3 (three) times daily as needed for muscle spasms.   folic acid (FOLVITE) 1 MG tablet Take 1 mg by mouth daily.   pantoprazole (PROTONIX) 40 MG tablet TAKE 1 TABLET(40 MG) BY MOUTH DAILY   pravastatin (PRAVACHOL) 20 MG tablet  TAKE 1 TABLET(20 MG) BY MOUTH DAILY   valsartan (DIOVAN) 160 MG tablet Take 1 tablet (160 mg total) by mouth daily.   No facility-administered encounter medications on file as of 11/05/2023.    Allergies  Allergen Reactions   Ibuprofen Rash   Naproxen Rash    Pertinent ROS per HPI, otherwise unremarkable      Objective:  BP 122/78   Pulse 68   Temp 97.9 F (36.6 C)   Ht 4\' 9"  (1.448 m)   Wt 118 lb 3.2 oz (53.6 kg)   SpO2 100%   BMI 25.58 kg/m    Wt Readings from Last 3 Encounters:  11/05/23 118 lb 3.2 oz (53.6 kg)  10/09/23 121 lb (54.9 kg)  07/09/23 123 lb (55.8 kg)    Physical Exam Vitals and nursing note reviewed.  Constitutional:      General: She is not in acute distress.    Appearance: Normal appearance. She is normal weight. She is not ill-appearing, toxic-appearing or diaphoretic.  HENT:     Head: Normocephalic and atraumatic.     Nose: Nose normal.     Mouth/Throat:     Mouth: Mucous membranes are moist.  Eyes:     Conjunctiva/sclera: Conjunctivae normal.     Pupils: Pupils are equal, round, and reactive to light.  Cardiovascular:     Rate and Rhythm: Normal rate and regular rhythm.     Heart sounds: Normal heart sounds.  Pulmonary:     Effort: Pulmonary effort is normal.     Breath sounds: Normal breath sounds.  Abdominal:     General: Abdomen is flat. Bowel sounds are normal.     Palpations: Abdomen is soft.     Tenderness: There is no abdominal tenderness. There is no right CVA tenderness or left CVA tenderness.  Musculoskeletal:     Cervical back: Normal.     Thoracic back: Spasms and tenderness present. No swelling, edema, deformity, signs of trauma, lacerations or bony tenderness. Normal range of motion. No scoliosis.     Lumbar back: Normal.  Skin:    General: Skin is warm and dry.     Capillary Refill: Capillary refill takes less than 2 seconds.  Neurological:     General: No focal deficit present.     Mental Status: She is alert and  oriented to person, place, and time.  Psychiatric:        Mood and Affect: Mood normal.        Behavior: Behavior normal.        Thought Content: Thought content normal.        Judgment: Judgment normal.    Physical Exam   GENITOURINARY: Urine analysis normal. MUSCULOSKELETAL: Right upper back muscle tightness.        Results for orders placed or  performed in visit on 10/09/23  Bayer DCA Hb A1c Waived  Result Value Ref Range   HB A1C (BAYER DCA - WAIVED) 7.2 (H) 4.8 - 5.6 %  Microalbumin / creatinine urine ratio  Result Value Ref Range   Creatinine, Urine 52.1 Not Estab. mg/dL   Microalbumin, Urine <1.6 Not Estab. ug/mL   Microalb/Creat Ratio <6 0 - 29 mg/g creat       Pertinent labs & imaging results that were available during my care of the patient were reviewed by me and considered in my medical decision making.  Assessment & Plan:  Amy Mccarty was seen today for back pain.  Diagnoses and all orders for this visit:  Acute right-sided thoracic back pain -     cyclobenzaprine (FLEXERIL) 5 MG tablet; Take 1 tablet (5 mg total) by mouth 3 (three) times daily as needed for muscle spasms.  Muscle spasm of back -     cyclobenzaprine (FLEXERIL) 5 MG tablet; Take 1 tablet (5 mg total) by mouth 3 (three) times daily as needed for muscle spasms.  Urinary frequency -     Urinalysis, Routine w reflex microscopic -     Urine Culture  Seasonal allergic rhinitis due to pollen -     cetirizine (ZYRTEC) 10 MG tablet; Take 1 tablet (10 mg total) by mouth daily.  Type 2 diabetes mellitus with diabetic neuropathy, without long-term current use of insulin (HCC)  Hyperlipidemia LDL goal <130     Assessment and Plan    Muscle Spasm in Right Upper Back Noted tightness and discomfort in the right upper back, likely due to muscle spasm. No kidney tenderness noted. -Prescribe anti-inflammatory medication for a few days. -Advise use of heat and topical treatments such as  Biofreeze.  Possible Urinary Tract Infection Reports of increased urination and chills. Urinalysis was normal. -No treatment necessary at this time.  Type 2 Diabetes Mellitus Recent A1c was 7.2, slightly above goal. Reports of fluctuating blood glucose levels at home. Not currently on any diabetic medications. -Continue monitoring blood glucose levels at home. -Follow up with Dr. Louanne Skye for repeat A1c in about two months. -Advise to increase water intake and limit simple carbohydrates.  Hyperlipidemia Recent cholesterol levels were within normal limits. -Continue current management.  Upper Respiratory Symptoms Reports of morning congestion and gagging on greenish mucus. -Advise over-the-counter Zyrtec.          Continue all other maintenance medications.  Follow up plan: Return if symptoms worsen or fail to improve.   Continue healthy lifestyle choices, including diet (rich in fruits, vegetables, and lean proteins, and low in salt and simple carbohydrates) and exercise (at least 30 minutes of moderate physical activity daily).   The above assessment and management plan was discussed with the patient. The patient verbalized understanding of and has agreed to the management plan. Patient is aware to call the clinic if they develop any new symptoms or if symptoms persist or worsen. Patient is aware when to return to the clinic for a follow-up visit. Patient educated on when it is appropriate to go to the emergency department.   Kari Baars, FNP-C [facility] [phone]

## 2023-11-07 LAB — URINE CULTURE

## 2024-01-14 ENCOUNTER — Encounter: Payer: Self-pay | Admitting: Family Medicine

## 2024-01-14 ENCOUNTER — Ambulatory Visit: Payer: BLUE CROSS/BLUE SHIELD | Admitting: Family Medicine

## 2024-01-14 VITALS — BP 136/79 | HR 63 | Ht <= 58 in | Wt 118.0 lb

## 2024-01-14 DIAGNOSIS — E1140 Type 2 diabetes mellitus with diabetic neuropathy, unspecified: Secondary | ICD-10-CM | POA: Diagnosis not present

## 2024-01-14 DIAGNOSIS — M62830 Muscle spasm of back: Secondary | ICD-10-CM | POA: Diagnosis not present

## 2024-01-14 DIAGNOSIS — K219 Gastro-esophageal reflux disease without esophagitis: Secondary | ICD-10-CM

## 2024-01-14 DIAGNOSIS — I10 Essential (primary) hypertension: Secondary | ICD-10-CM | POA: Diagnosis not present

## 2024-01-14 DIAGNOSIS — E785 Hyperlipidemia, unspecified: Secondary | ICD-10-CM | POA: Diagnosis not present

## 2024-01-14 DIAGNOSIS — I152 Hypertension secondary to endocrine disorders: Secondary | ICD-10-CM

## 2024-01-14 DIAGNOSIS — M546 Pain in thoracic spine: Secondary | ICD-10-CM

## 2024-01-14 DIAGNOSIS — E1159 Type 2 diabetes mellitus with other circulatory complications: Secondary | ICD-10-CM

## 2024-01-14 LAB — BAYER DCA HB A1C WAIVED: HB A1C (BAYER DCA - WAIVED): 6.8 % — ABNORMAL HIGH (ref 4.8–5.6)

## 2024-01-14 MED ORDER — PANTOPRAZOLE SODIUM 40 MG PO TBEC: 40.0000 mg | DELAYED_RELEASE_TABLET | Freq: Every day | ORAL | 3 refills | Status: DC

## 2024-01-14 MED ORDER — PRAVASTATIN SODIUM 20 MG PO TABS: 20.0000 mg | ORAL_TABLET | Freq: Every day | ORAL | 3 refills | Status: DC

## 2024-01-14 MED ORDER — CYCLOBENZAPRINE HCL 5 MG PO TABS: 5.0000 mg | ORAL_TABLET | Freq: Three times a day (TID) | ORAL | 0 refills | Status: DC | PRN

## 2024-01-14 NOTE — Progress Notes (Addendum)
BP 136/79   Pulse 63   Ht 4\' 9"  (1.448 m)   Wt 118 lb (53.5 kg)   SpO2 99%   BMI 25.53 kg/m    Subjective:   Patient ID: Amy Mccarty, female    DOB: 12-07-1961, 63 y.o.   MRN: [id number]  HPI: Amy Mccarty is a 63 y.o. female presenting on 01/14/2024 for Medical Management of Chronic Issues, Diabetes, Hypertension, and Back Pain (Mid back.)   HPI Type 2 diabetes mellitus Patient comes in today for recheck of his diabetes. Patient has been currently taking no medication, diet control. Patient is currently on an ACE inhibitor/ARB. Patient has not seen an ophthalmologist this year. Patient denies any new issues with their feet. The symptom started onset as an adult hypertension and hyperlipidemia ARE RELATED TO DM   Hypertension Patient is currently on amlodipine and valsartan, and their blood pressure today is 136/79. Patient denies any lightheadedness or dizziness. Patient denies headaches, blurred vision, chest pains, shortness of breath, or weakness. Denies any side effects from medication and is content with current medication.   Hyperlipidemia Patient is coming in for recheck of his hyperlipidemia. The patient is currently taking pravastatin. They deny any issues with myalgias or history of liver damage from it. They deny any focal numbness or weakness or chest pain.   Patient has complained of some mid back pain that started towards her right side but goes across to her left side.  She did try some muscle relaxer for and it helped a little bit but not significantly.  She says it still just bothers her especially when she is up and moving around or up on her feet or up sitting up.  It does not bother her too much when she is laying down.  She says it started a couple months ago.  If not severe, it is very mild but it has been nagging and not improving.  Relevant past medical, surgical, family and social history reviewed and updated as indicated. Interim medical history since  our last visit reviewed. Allergies and medications reviewed and updated.  Review of Systems  Constitutional:  Negative for chills and fever.  Eyes:  Negative for visual disturbance.  Respiratory:  Negative for chest tightness and shortness of breath.   Cardiovascular:  Negative for chest pain and leg swelling.  Genitourinary:  Negative for difficulty urinating and dysuria.  Musculoskeletal:  Positive for back pain and myalgias. Negative for arthralgias, gait problem, neck pain and neck stiffness.  Skin:  Negative for rash.  Neurological:  Negative for light-headedness and headaches.  Psychiatric/Behavioral:  Negative for agitation and behavioral problems.   All other systems reviewed and are negative.   Per HPI unless specifically indicated above   Allergies as of 01/14/2024       Reactions   Ibuprofen Rash   Naproxen Rash        Medication List        Accurate as of January 14, 2024 11:59 PM. If you have any questions, ask your nurse or doctor.          STOP taking these medications    ciclopirox 8 % solution Commonly known as: PENLAC Stopped by: Elige Radon Aneisa Karren       TAKE these medications    amLODipine 5 MG tablet Commonly known as: NORVASC TAKE 1 TABLET(5 MG) BY MOUTH DAILY   cetirizine 10 MG tablet Commonly known as: ZYRTEC Take 1 tablet (10 mg total) by mouth daily.  cyclobenzaprine 5 MG tablet Commonly known as: FLEXERIL Take 1 tablet (5 mg total) by mouth 3 (three) times daily as needed for muscle spasms.   folic acid 1 MG tablet Commonly known as: FOLVITE Take 1 mg by mouth daily.   pantoprazole 40 MG tablet Commonly known as: PROTONIX Take 1 tablet (40 mg total) by mouth daily. What changed: See the new instructions. Changed by: Elige Radon Robbyn Hodkinson   pravastatin 20 MG tablet Commonly known as: PRAVACHOL Take 1 tablet (20 mg total) by mouth daily. What changed: See the new instructions. Changed by: Elige Radon Jenette Rayson   valsartan  160 MG tablet Commonly known as: DIOVAN Take 1 tablet (160 mg total) by mouth daily.         Objective:   BP 136/79   Pulse 63   Ht 4\' 9"  (1.448 m)   Wt 118 lb (53.5 kg)   SpO2 99%   BMI 25.53 kg/m   Wt Readings from Last 3 Encounters:  01/14/24 118 lb (53.5 kg)  11/05/23 118 lb 3.2 oz (53.6 kg)  10/09/23 121 lb (54.9 kg)    Physical Exam Vitals and nursing note reviewed.  Constitutional:      General: She is not in acute distress.    Appearance: She is well-developed. She is not diaphoretic.  Eyes:     Conjunctiva/sclera: Conjunctivae normal.  Cardiovascular:     Rate and Rhythm: Normal rate and regular rhythm.     Heart sounds: Normal heart sounds. No murmur heard. Pulmonary:     Effort: Pulmonary effort is normal. No respiratory distress.     Breath sounds: Normal breath sounds. No wheezing.  Musculoskeletal:        General: No swelling or tenderness (No tenderness on exam, also unable to elicit with range of motion today). Normal range of motion.  Skin:    General: Skin is warm and dry.     Findings: No rash.  Neurological:     Mental Status: She is alert and oriented to person, place, and time.     Coordination: Coordination normal.  Psychiatric:        Behavior: Behavior normal.       Assessment & Plan:   Problem List Items Addressed This Visit       Cardiovascular and Mediastinum   Hypertension associated with type 2 diabetes mellitus (HCC)   Relevant Medications   pravastatin (PRAVACHOL) 20 MG tablet     Digestive   GERD (gastroesophageal reflux disease)   Relevant Medications   pantoprazole (PROTONIX) 40 MG tablet     Endocrine   Type 2 diabetes mellitus with diabetic neuropathy, without long-term current use of insulin (HCC) - Primary   Relevant Medications   pravastatin (PRAVACHOL) 20 MG tablet   Other Relevant Orders   CBC with Differential/Platelet (Completed)   CMP14+EGFR (Completed)   Lipid panel (Completed)   Bayer DCA Hb A1c  Waived (Completed)     Other   Hyperlipidemia LDL goal <130   Relevant Medications   pravastatin (PRAVACHOL) 20 MG tablet   Other Relevant Orders   CBC with Differential/Platelet (Completed)   CMP14+EGFR (Completed)   Lipid panel (Completed)   Bayer DCA Hb A1c Waived (Completed)   Other Visit Diagnoses       Muscle spasm of back       Relevant Medications   cyclobenzaprine (FLEXERIL) 5 MG tablet   Other Relevant Orders   Ambulatory referral to Physical Therapy     Acute right-sided  thoracic back pain       Relevant Medications   cyclobenzaprine (FLEXERIL) 5 MG tablet     A1c looks good at 6.8.  No changes there.  Will do referral to physical therapy for her back.  Follow up plan: Return in about 3 months (around 04/13/2024), or if symptoms worsen or fail to improve, for Diabetes and hypertension and cholesterol.  Counseling provided for all of the vaccine components Orders Placed This Encounter  Procedures   CBC with Differential/Platelet   CMP14+EGFR   Lipid panel   Bayer DCA Hb A1c Waived   Ambulatory referral to Physical Therapy    Arville Care, MD [facility] 01/22/2024, 7:34 AM

## 2024-01-15 LAB — CBC WITH DIFFERENTIAL/PLATELET
Basophils Absolute: 0 10*3/uL (ref 0.0–0.2)
Basos: 1 %
EOS (ABSOLUTE): 0.2 10*3/uL (ref 0.0–0.4)
Eos: 4 %
Hematocrit: 39.9 % (ref 34.0–46.6)
Hemoglobin: 13.2 g/dL (ref 11.1–15.9)
Immature Grans (Abs): 0 10*3/uL (ref 0.0–0.1)
Immature Granulocytes: 0 %
Lymphocytes Absolute: 1.8 10*3/uL (ref 0.7–3.1)
Lymphs: 32 %
MCH: 28.7 pg (ref 26.6–33.0)
MCHC: 33.1 g/dL (ref 31.5–35.7)
MCV: 87 fL (ref 79–97)
Monocytes Absolute: 0.4 10*3/uL (ref 0.1–0.9)
Monocytes: 7 %
Neutrophils Absolute: 3.2 10*3/uL (ref 1.4–7.0)
Neutrophils: 56 %
Platelets: 256 10*3/uL (ref 150–450)
RBC: 4.6 x10E6/uL (ref 3.77–5.28)
RDW: 13.1 % (ref 11.7–15.4)
WBC: 5.6 10*3/uL (ref 3.4–10.8)

## 2024-01-15 LAB — LIPID PANEL
Chol/HDL Ratio: 2.3 {ratio} (ref 0.0–4.4)
Cholesterol, Total: 174 mg/dL (ref 100–199)
HDL: 76 mg/dL (ref >39)
LDL Chol Calc (NIH): 86 mg/dL (ref 0–99)
Triglycerides: 65 mg/dL (ref 0–149)
VLDL Cholesterol Cal: 12 mg/dL (ref 5–40)

## 2024-01-15 LAB — CMP14+EGFR
ALT: 12 [IU]/L (ref 0–32)
AST: 18 [IU]/L (ref 0–40)
Albumin: 4.2 g/dL (ref 3.8–4.9)
Alkaline Phosphatase: 90 [IU]/L (ref 44–121)
BUN/Creatinine Ratio: 12 (ref 12–28)
BUN: 9 mg/dL (ref 8–27)
Bilirubin Total: 0.4 mg/dL (ref 0.0–1.2)
CO2: 25 mmol/L (ref 20–29)
Calcium: 9.4 mg/dL (ref 8.7–10.3)
Chloride: 102 mmol/L (ref 96–106)
Creatinine, Ser: 0.75 mg/dL (ref 0.57–1.00)
Globulin, Total: 2.5 g/dL (ref 1.5–4.5)
Glucose: 124 mg/dL — ABNORMAL HIGH (ref 70–99)
Potassium: 4.1 mmol/L (ref 3.5–5.2)
Sodium: 140 mmol/L (ref 134–144)
Total Protein: 6.7 g/dL (ref 6.0–8.5)
eGFR: 91 mL/min/{1.73_m2} (ref >59)

## 2024-01-22 ENCOUNTER — Encounter: Payer: Self-pay | Admitting: Family Medicine

## 2024-01-22 ENCOUNTER — Ambulatory Visit: Payer: BLUE CROSS/BLUE SHIELD | Admitting: Physical Therapy

## 2024-01-28 ENCOUNTER — Ambulatory Visit: Payer: BLUE CROSS/BLUE SHIELD | Attending: Family Medicine | Admitting: Physical Therapy

## 2024-01-28 ENCOUNTER — Other Ambulatory Visit: Payer: Self-pay

## 2024-01-28 DIAGNOSIS — M62830 Muscle spasm of back: Secondary | ICD-10-CM | POA: Insufficient documentation

## 2024-01-28 DIAGNOSIS — M546 Pain in thoracic spine: Secondary | ICD-10-CM | POA: Insufficient documentation

## 2024-01-28 NOTE — Therapy (Signed)
OUTPATIENT PHYSICAL THERAPY THORACOLUMBAR EVALUATION   Patient Name: Amy Mccarty MRN: [id number] DOB:23-Jun-1961, 63 y.o., female Today's Date: 01/28/2024  END OF SESSION:  PT End of Session - 01/28/24 1503     Visit Number 1    Number of Visits 12    Date for PT Re-Evaluation 03/10/24    PT Start Time 0143    PT Stop Time 0227    PT Time Calculation (min) 44 min    Activity Tolerance Patient tolerated treatment well    Behavior During Therapy [facility] for tasks assessed/performed             Past Medical History:  Diagnosis Date   GERD (gastroesophageal reflux disease)    Hyperlipidemia    Hypertension    Past Surgical History:  Procedure Laterality Date   CESAREAN SECTION     COLPOSCOPY     Patient Active Problem List   Diagnosis Date Noted   Type 2 diabetes mellitus with diabetic neuropathy, without long-term current use of insulin (HCC) 12/15/2020   Dysesthesia of scalp 11/29/2020   Hypertension associated with type 2 diabetes mellitus (HCC) 12/21/2019   BMI 29.0-29.9,adult 06/18/2019   Skin tag of perianal region 12/07/2018   Refuses tetanus, diphtheria, and acellular pertussis (Tdap) vaccination 12/07/2018   Refused influenza vaccine 12/07/2018   Dyspareunia in female 12/07/2018   Skin tags, multiple acquired 12/07/2018   Hyperlipidemia LDL goal <130 11/30/2018   GERD (gastroesophageal reflux disease) 11/30/2018     REFERRING PROVIDER: Ivin Booty Dettinger MD  REFERRING DIAG: Muscle spasm of back  Rationale for Evaluation and Treatment: Rehabilitation  THERAPY DIAG:  Pain in thoracic spine  Muscle spasm of back  ONSET DATE: ~3 months.  SUBJECTIVE:                                                                                                                                                                                           SUBJECTIVE STATEMENT: The patient presents to the clinic with c/o mid-back pain that began about 3 months ago.  She  cannot recall any injury.  The pain began in her left mid-back region and has now radiated to the right side as well.  She rates her pain at a 6/10.  Increased activity increases her pain to higher levels.  Rest decreases pain.  PERTINENT HISTORY:  Unremarkable.  PAIN: 6/10.    PRECAUTIONS: None  RED FLAGS: None   WEIGHT BEARING RESTRICTIONS: No  FALLS:  Has patient fallen in last 6 months? No  LIVING ENVIRONMENT: Lives with: lives with their spouse Lives in: House/apartment Has following equipment at home: None  PLOF: Independent  PATIENT GOALS: Not have pain.    OBJECTIVE:  Note: Objective measures were completed at Evaluation unless otherwise noted.   PATIENT SURVEYS:  FOTO 49.40.   POSTURE: No Significant postural limitations  PALPATION: Tender to palpation at T6-T7 and over the scapular musculature bilaterally in this region.    UE ROM:  WNL.   UE EXTREMITY MMT:    Normal bilateral UE and scapular strength.    DTR's:  Normal bilateral UE DTR's.   TREATMENT DATE: 01/28/24:   HMP and IFC at 80-150 Hz on 40% scan x 20 minutes to patient's bilateral mid-back musculature.   Normal modality response following removal of modality.                                                                                                                              PATIENT EDUCATION:  Education details:  Person educated:  International aid/development worker:  Education comprehension:   HOME EXERCISE PROGRAM:   ASSESSMENT:  CLINICAL IMPRESSION: The patient presents to OPPT with c/o bilateral mid-back pain that has been ongoing for about 3 months.  She has normal bilateral UE DTR's and her strength is normal.  The patient is tender to palpation at T6-T7 and over the scapular musculature bilaterally in this region.  Increased activity increases her pain to high levels.  Her FOTO limitation score is 49.40.  Patient will benefit from skilled physical therapy intervention to address pain  and deficits.  OBJECTIVE IMPAIRMENTS: decreased activity tolerance, increased muscle spasms, and pain.   ACTIVITY LIMITATIONS: carrying and lifting  PARTICIPATION LIMITATIONS: meal prep, cleaning, and laundry  REHAB POTENTIAL: Excellent  CLINICAL DECISION MAKING: Evolving/moderate complexity  EVALUATION COMPLEXITY: Low   GOALS:  LONG TERM GOALS: Target date: 03/10/24  Ind with a HEP.  Goal status: INITIAL  2.  Perform ADL's with pain not > 2/10.  Goal status: INITIAL  PLAN:  PT FREQUENCY: 2x/week  PT DURATION: 6 weeks  PLANNED INTERVENTIONS: 97110-Therapeutic exercises, 97530- Therapeutic activity, [id number]- Neuromuscular re-education, 97535- Self Care, 16109- Manual therapy, 97014- Electrical stimulation (unattended), 97035- Ultrasound, Patient/Family education, Cryotherapy, and Moist heat.  PLAN FOR NEXT SESSION: Combo e'stim/US at 1.50 W/CM2, STW/M, gentle mid-thoracic and costo-vertebral mobs, theraband resisted scapular exercises.     Alvah Gilder, [place], PT 01/28/2024, 3:39 PM

## 2024-02-03 ENCOUNTER — Encounter: Payer: Self-pay | Admitting: Family Medicine

## 2024-02-06 ENCOUNTER — Encounter: Payer: Self-pay | Admitting: *Deleted

## 2024-02-06 ENCOUNTER — Ambulatory Visit: Payer: BLUE CROSS/BLUE SHIELD | Attending: Family Medicine | Admitting: *Deleted

## 2024-02-06 DIAGNOSIS — M62830 Muscle spasm of back: Secondary | ICD-10-CM | POA: Diagnosis not present

## 2024-02-06 DIAGNOSIS — M546 Pain in thoracic spine: Secondary | ICD-10-CM | POA: Diagnosis not present

## 2024-02-06 NOTE — Therapy (Signed)
 OUTPATIENT PHYSICAL THERAPY THORACOLUMBAR TREATMENT   Patient Name: Amy Mccarty MRN: [id number] DOB:[birth year], 63 y.o., female Today's Date: 02/06/2024  END OF SESSION:  PT End of Session - 02/06/24 1017     Visit Number 2    Number of Visits 12    Date for PT Re-Evaluation 03/10/24    PT Start Time 1017    PT Stop Time 1103    PT Time Calculation (min) 46 min             Past Medical History:  Diagnosis Date   GERD (gastroesophageal reflux disease)    Hyperlipidemia    Hypertension    Past Surgical History:  Procedure Laterality Date   CESAREAN SECTION     COLPOSCOPY     Patient Active Problem List   Diagnosis Date Noted   Type 2 diabetes mellitus with diabetic neuropathy, without long-term current use of insulin (HCC) 12/15/2020   Dysesthesia of scalp 11/29/2020   Hypertension associated with type 2 diabetes mellitus (HCC) 12/21/2019   BMI 29.0-29.9,adult 06/18/2019   Skin tag of perianal region 12/07/2018   Refuses tetanus, diphtheria, and acellular pertussis (Tdap) vaccination 12/07/2018   Refused influenza vaccine 12/07/2018   Dyspareunia in female 12/07/2018   Skin tags, multiple acquired 12/07/2018   Hyperlipidemia LDL goal <130 11/30/2018   GERD (gastroesophageal reflux disease) 11/30/2018     REFERRING PROVIDER: Fonda Dettinger MD  REFERRING DIAG: Muscle spasm of back  Rationale for Evaluation and Treatment: Rehabilitation  THERAPY DIAG:  Pain in thoracic spine  Muscle spasm of back  ONSET DATE: ~3 months.  SUBJECTIVE:                                                                                                                                                                                           SUBJECTIVE STATEMENT: The patient presents to the clinic with c/o mid-back pain that began about 3 months ago. Pain today 3/10   PERTINENT HISTORY:  Unremarkable.  PAIN: 3/10.    PRECAUTIONS: None  RED FLAGS: None   WEIGHT  BEARING RESTRICTIONS: No  FALLS:  Has patient fallen in last 6 months? No  LIVING ENVIRONMENT: Lives with: lives with their spouse Lives in: House/apartment Has following equipment at home: None  PLOF: Independent  PATIENT GOALS: Not have pain.    OBJECTIVE:  Note: Objective measures were completed at Evaluation unless otherwise noted.   PATIENT SURVEYS:  FOTO 49.40.   POSTURE: No Significant postural limitations  PALPATION: Tender to palpation at T6-T7 and over the scapular musculature bilaterally in this region.    UE ROM:  WNL.   UE  EXTREMITY MMT:    Normal bilateral UE and scapular strength.    DTR's:  Normal bilateral UE DTR's.   TREATMENT DATE: 02/06/24:   Posture  therex with yellow tband Rows x 10,ext x 10, H-abd x 10   Handout given  US  combo x 10 mins to thoracolumbar paras with Pt sitting Manual STW to Bil thoracolumbar paras   HMP and IFC at 80-150 Hz on 40% scan x 15 minutes to patient's bilateral mid-back musculature.   Normal modality response following removal of modality.                                                                                                                             PATIENT EDUCATION:  Education details:  Person educated:  International aid/development worker:  Education comprehension:   HOME EXERCISE PROGRAM:   ASSESSMENT:  CLINICAL IMPRESSION: The patient  arrived to clinic today doing fairly well with mild mid-back pain. Rx focused on postural tband exs for HEP f/b US  combo and manual STW to Bil thoracolumbar paras. Notable soreness a long both sides. Pt reports decreased pain end of session. Handout given for HEP   OBJECTIVE IMPAIRMENTS: decreased activity tolerance, increased muscle spasms, and pain.   ACTIVITY LIMITATIONS: carrying and lifting  PARTICIPATION LIMITATIONS: meal prep, cleaning, and laundry  REHAB POTENTIAL: Excellent  CLINICAL DECISION MAKING: Evolving/moderate complexity  EVALUATION COMPLEXITY:  Low   GOALS:  LONG TERM GOALS: Target date: 03/10/24  Ind with a HEP.  Goal status: INITIAL  2.  Perform ADL's with pain not > 2/10.  Goal status: INITIAL  PLAN:  PT FREQUENCY: 2x/week  PT DURATION: 6 weeks  PLANNED INTERVENTIONS: 97110-Therapeutic exercises, 97530- Therapeutic activity, [id number]- Neuromuscular re-education, 97535- Self Care, [id number]- Manual therapy, 97014- Electrical stimulation (unattended), 97035- Ultrasound, Patient/Family education, Cryotherapy, and Moist heat.  PLAN FOR NEXT SESSION: Combo e'stim/US  at 1.50 W/CM2, STW/M, gentle mid-thoracic and costo-vertebral mobs, theraband resisted scapular exercises.     Dimarco Minkin,CHRIS, PTA 02/06/2024, 1:11 PM

## 2024-02-12 ENCOUNTER — Ambulatory Visit: Payer: BLUE CROSS/BLUE SHIELD | Admitting: Physical Therapy

## 2024-02-12 DIAGNOSIS — M546 Pain in thoracic spine: Secondary | ICD-10-CM | POA: Diagnosis not present

## 2024-02-12 DIAGNOSIS — M62830 Muscle spasm of back: Secondary | ICD-10-CM | POA: Diagnosis not present

## 2024-02-12 NOTE — Therapy (Signed)
OUTPATIENT PHYSICAL THERAPY THORACOLUMBAR TREATMENT   Patient Name: Amy Mccarty MRN: [id number] DOB:[birth year], 63 y.o., female Today's Date: 02/12/2024  END OF SESSION:  PT End of Session - 02/12/24 1517     Visit Number 3    Number of Visits 12    Date for PT Re-Evaluation 03/10/24    PT Start Time 0220    PT Stop Time 0311    PT Time Calculation (min) 51 min    Activity Tolerance Patient tolerated treatment well    Behavior During Therapy [facility] for tasks assessed/performed             Past Medical History:  Diagnosis Date   GERD (gastroesophageal reflux disease)    Hyperlipidemia    Hypertension    Past Surgical History:  Procedure Laterality Date   CESAREAN SECTION     COLPOSCOPY     Patient Active Problem List   Diagnosis Date Noted   Type 2 diabetes mellitus with diabetic neuropathy, without long-term current use of insulin (HCC) 12/15/2020   Dysesthesia of scalp 11/29/2020   Hypertension associated with type 2 diabetes mellitus (HCC) 12/21/2019   BMI 29.0-29.9,adult 06/18/2019   Skin tag of perianal region 12/07/2018   Refuses tetanus, diphtheria, and acellular pertussis (Tdap) vaccination 12/07/2018   Refused influenza vaccine 12/07/2018   Dyspareunia in female 12/07/2018   Skin tags, multiple acquired 12/07/2018   Hyperlipidemia LDL goal <130 11/30/2018   GERD (gastroesophageal reflux disease) 11/30/2018     REFERRING PROVIDER: Ivin Booty Dettinger MD  REFERRING DIAG: Muscle spasm of back  Rationale for Evaluation and Treatment: Rehabilitation  THERAPY DIAG:  Pain in thoracic spine  Muscle spasm of back  ONSET DATE: ~3 months.  SUBJECTIVE:                                                                                                                                                                                           SUBJECTIVE STATEMENT: Been moving some furniture around for grandchild's room.    PERTINENT HISTORY:   Unremarkable.  PAIN: 3/10.    PRECAUTIONS: None  RED FLAGS: None   WEIGHT BEARING RESTRICTIONS: No  FALLS:  Has patient fallen in last 6 months? No  LIVING ENVIRONMENT: Lives with: lives with their spouse Lives in: House/apartment Has following equipment at home: None  PLOF: Independent  PATIENT GOALS: Not have pain.    OBJECTIVE:  Note: Objective measures were completed at Evaluation unless otherwise noted.   PATIENT SURVEYS:  FOTO 49.40.   POSTURE: No Significant postural limitations  PALPATION: Tender to palpation at T6-T7 and over the scapular musculature bilaterally in this region.  UE ROM:  WNL.   UE EXTREMITY MMT:    Normal bilateral UE and scapular strength.    DTR's:  Normal bilateral UE DTR's.    TODAY's TX:  02/12/24:  UBE x 8 minutes f/b combo e'stim/US at 1.50 W/CM2 x 8 minutes with patient in prone on plinth with face in face hole f/b STW/M x 8 minutes and gentle mid-thoracic PA mobs and costo-vertebral mobs f/b  HMP and IFC at 80-150 Hz on 40% scan x 20 minutes to patient's bilateral mid-back musculature in supine with leg elevator.   Normal modality response following removal of modality.                         TREATMENT DATE: 02/06/24:   Posture  therex with yellow tband Rows x 10,ext x 10, H-abd x 10   Handout given  [place] combo x 10 mins to thoracolumbar paras with Pt sitting Manual STW to Bil thoracolumbar paras   HMP and IFC at 80-150 Hz on 40% scan x 15 minutes to patient's bilateral mid-back musculature.   Normal modality response following removal of modality.                                                                                                                             PATIENT EDUCATION:  Education details:  Person educated:  International aid/development worker:  Education comprehension:   HOME EXERCISE PROGRAM:   ASSESSMENT:  CLINICAL IMPRESSION:  Patient  did well today with treatment.  CC of pain today was at T6-7,  especially on the right.  She tolerated treatment without complaint and felt better following.    OBJECTIVE IMPAIRMENTS: decreased activity tolerance, increased muscle spasms, and pain.   ACTIVITY LIMITATIONS: carrying and lifting  PARTICIPATION LIMITATIONS: meal prep, cleaning, and laundry  REHAB POTENTIAL: Excellent  CLINICAL DECISION MAKING: Evolving/moderate complexity  EVALUATION COMPLEXITY: Low   GOALS:  LONG TERM GOALS: Target date: 03/10/24  Ind with a HEP.  Goal status: INITIAL  2.  Perform ADL's with pain not > 2/10.  Goal status: INITIAL  PLAN:  PT FREQUENCY: 2x/week  PT DURATION: 6 weeks  PLANNED INTERVENTIONS: 97110-Therapeutic exercises, 97530- Therapeutic activity, [id number]- Neuromuscular re-education, 97535- Self Care, 69629- Manual therapy, 97014- Electrical stimulation (unattended), 97035- Ultrasound, Patient/Family education, Cryotherapy, and Moist heat.  PLAN FOR NEXT SESSION: Combo e'stim/US at 1.50 W/CM2, STW/M, gentle mid-thoracic and costo-vertebral mobs, theraband resisted scapular exercises.     Rylend Pietrzak, [place], PT 02/12/2024, 3:43 PM

## 2024-02-17 ENCOUNTER — Ambulatory Visit: Payer: BLUE CROSS/BLUE SHIELD | Admitting: *Deleted

## 2024-02-17 ENCOUNTER — Encounter: Payer: Self-pay | Admitting: *Deleted

## 2024-02-17 DIAGNOSIS — M62830 Muscle spasm of back: Secondary | ICD-10-CM | POA: Diagnosis not present

## 2024-02-17 DIAGNOSIS — M546 Pain in thoracic spine: Secondary | ICD-10-CM

## 2024-02-17 NOTE — Therapy (Signed)
 OUTPATIENT PHYSICAL THERAPY THORACOLUMBAR TREATMENT   Patient Name: Amy Mccarty MRN: [id number] DOB:1961/11/26, 63 y.o., female Today's Date: 02/17/2024  END OF SESSION:  PT End of Session - 02/17/24 1106     Visit Number 4    Number of Visits 12    Date for PT Re-Evaluation 03/10/24    PT Start Time 1100    PT Stop Time 1150    PT Time Calculation (min) 50 min             Past Medical History:  Diagnosis Date   GERD (gastroesophageal reflux disease)    Hyperlipidemia    Hypertension    Past Surgical History:  Procedure Laterality Date   CESAREAN SECTION     COLPOSCOPY     Patient Active Problem List   Diagnosis Date Noted   Type 2 diabetes mellitus with diabetic neuropathy, without long-term current use of insulin (HCC) 12/15/2020   Dysesthesia of scalp 11/29/2020   Hypertension associated with type 2 diabetes mellitus (HCC) 12/21/2019   BMI 29.0-29.9,adult 06/18/2019   Skin tag of perianal region 12/07/2018   Refuses tetanus, diphtheria, and acellular pertussis (Tdap) vaccination 12/07/2018   Refused influenza vaccine 12/07/2018   Dyspareunia in female 12/07/2018   Skin tags, multiple acquired 12/07/2018   Hyperlipidemia LDL goal <130 11/30/2018   GERD (gastroesophageal reflux disease) 11/30/2018     REFERRING PROVIDER: Ivin Booty Dettinger MD  REFERRING DIAG: Muscle spasm of back  Rationale for Evaluation and Treatment: Rehabilitation  THERAPY DIAG:  Pain in thoracic spine  Muscle spasm of back  ONSET DATE: ~3 months.  SUBJECTIVE:                                                                                                                                                                                           SUBJECTIVE STATEMENT: Pain 3/10  mid-back. Doing better over all   PERTINENT HISTORY:  Unremarkable.  PAIN: 3/10.    PRECAUTIONS: None  RED FLAGS: None   WEIGHT BEARING RESTRICTIONS: No  FALLS:  Has patient fallen in last 6  months? No  LIVING ENVIRONMENT: Lives with: lives with their spouse Lives in: House/apartment Has following equipment at home: None  PLOF: Independent  PATIENT GOALS: Not have pain.    OBJECTIVE:  Note: Objective measures were completed at Evaluation unless otherwise noted.   PATIENT SURVEYS:  FOTO 49.40.   POSTURE: No Significant postural limitations  PALPATION: Tender to palpation at T6-T7 and over the scapular musculature bilaterally in this region.    UE ROM:  WNL.   UE EXTREMITY MMT:    Normal bilateral UE and scapular strength.  DTR's:  Normal bilateral UE DTR's.    TODAY's TX:  02/17/24:    Nustep for UE use x 8 minutes  combo e'stim/US at 1.50 W/CM2 x 8 minutes with patient in prone on plinth with face in face hole   STW/M x 8 minutes and gentle mid-thoracic.   HMP and IFC at 80-150 Hz on 40% scan x 15 minutes to patient's bilateral mid-back musculature in supine with wedge.   Normal modality response following removal of modality.                         TREATMENT DATE: 02/06/24:   Posture  therex with yellow tband Rows x 10,ext x 10, H-abd x 10   Handout given  [place] combo x 10 mins to thoracolumbar paras with Pt sitting Manual STW to Bil thoracolumbar paras  HMP and IFC at 80-150 Hz on 40% scan x 15 minutes to patient's bilateral mid-back musculature.   Normal modality response following removal of modality.                                                                                                                             PATIENT EDUCATION:  Education details:  Person educated:  International aid/development worker:  Education comprehension:   HOME EXERCISE PROGRAM:   ASSESSMENT:  CLINICAL IMPRESSION:  Patient arrived today doing better overall with decreased pain and did good after last Rx. Notable TP RT side paraspinal at T6-7 area, but with good release with STW/TPR She reports decreased pain end end of session.    OBJECTIVE IMPAIRMENTS:  decreased activity tolerance, increased muscle spasms, and pain.   ACTIVITY LIMITATIONS: carrying and lifting  PARTICIPATION LIMITATIONS: meal prep, cleaning, and laundry  REHAB POTENTIAL: Excellent  CLINICAL DECISION MAKING: Evolving/moderate complexity  EVALUATION COMPLEXITY: Low   GOALS:  LONG TERM GOALS: Target date: 03/10/24  Ind with a HEP.  Goal status: On going  2.  Perform ADL's with pain not > 2/10.  Goal status: On going  PLAN:  PT FREQUENCY: 2x/week  PT DURATION: 6 weeks  PLANNED INTERVENTIONS: 97110-Therapeutic exercises, 97530- Therapeutic activity, [id number]- Neuromuscular re-education, 97535- Self Care, 47829- Manual therapy, 97014- Electrical stimulation (unattended), 97035- Ultrasound, Patient/Family education, Cryotherapy, and Moist heat.  PLAN FOR NEXT SESSION: Combo e'stim/US at 1.50 W/CM2, STW/M, gentle mid-thoracic and costo-vertebral mobs, theraband resisted scapular exercises.     Trevion Hoben,CHRIS, PTA 02/17/2024, 3:12 PM

## 2024-04-21 ENCOUNTER — Ambulatory Visit: Payer: BLUE CROSS/BLUE SHIELD | Admitting: Family Medicine

## 2024-05-11 DIAGNOSIS — R350 Frequency of micturition: Secondary | ICD-10-CM | POA: Diagnosis not present

## 2024-05-11 DIAGNOSIS — Z1231 Encounter for screening mammogram for malignant neoplasm of breast: Secondary | ICD-10-CM | POA: Diagnosis not present

## 2024-05-11 DIAGNOSIS — Z1331 Encounter for screening for depression: Secondary | ICD-10-CM | POA: Diagnosis not present

## 2024-05-11 DIAGNOSIS — Z01419 Encounter for gynecological examination (general) (routine) without abnormal findings: Secondary | ICD-10-CM | POA: Diagnosis not present

## 2024-05-11 DIAGNOSIS — Z124 Encounter for screening for malignant neoplasm of cervix: Secondary | ICD-10-CM | POA: Diagnosis not present

## 2024-05-21 ENCOUNTER — Encounter: Payer: Self-pay | Admitting: Family Medicine

## 2024-05-21 ENCOUNTER — Ambulatory Visit: Admitting: Family Medicine

## 2024-05-21 VITALS — BP 130/81 | HR 78 | Ht <= 58 in | Wt 121.0 lb

## 2024-05-21 DIAGNOSIS — E1159 Type 2 diabetes mellitus with other circulatory complications: Secondary | ICD-10-CM | POA: Diagnosis not present

## 2024-05-21 DIAGNOSIS — E1140 Type 2 diabetes mellitus with diabetic neuropathy, unspecified: Secondary | ICD-10-CM | POA: Diagnosis not present

## 2024-05-21 DIAGNOSIS — I10 Essential (primary) hypertension: Secondary | ICD-10-CM | POA: Diagnosis not present

## 2024-05-21 DIAGNOSIS — E785 Hyperlipidemia, unspecified: Secondary | ICD-10-CM

## 2024-05-21 DIAGNOSIS — I152 Hypertension secondary to endocrine disorders: Secondary | ICD-10-CM

## 2024-05-21 LAB — BAYER DCA HB A1C WAIVED: HB A1C (BAYER DCA - WAIVED): 6.8 % — ABNORMAL HIGH (ref 4.8–5.6)

## 2024-05-21 MED ORDER — VALSARTAN 160 MG PO TABS: 160.0000 mg | ORAL_TABLET | Freq: Every day | ORAL | 3 refills | Status: AC

## 2024-05-21 NOTE — Progress Notes (Signed)
 BP 130/81   Pulse 78   Ht 4\' 9"  (1.448 m)   Wt 121 lb (54.9 kg)   SpO2 98%   BMI 26.18 kg/m    Subjective:   Patient ID: Amy Mccarty, female    DOB: [birth year], 63 y.o.   MRN: [id number]  HPI: Amy Mccarty is a 63 y.o. female presenting on 05/21/2024 for Medical Management of Chronic Issues and Diabetes   HPI Type 2 diabetes mellitus Patient comes in today for recheck of his diabetes. Patient has been currently taking no medication currently, trying for diet control.. Patient is currently on an ACE inhibitor/ARB. Patient has not seen an ophthalmologist this year. Patient denies any new issues with their feet. The symptom started onset as an adult hypertension and hyperlipidemia ARE RELATED TO DM   Hypertension Patient is currently on amlodipine  and valsartan , and their blood pressure today is 130/81. Patient denies any lightheadedness or dizziness. Patient denies headaches, blurred vision, chest pains, shortness of breath, or weakness. Denies any side effects from medication and is content with current medication.   Hyperlipidemia Patient is coming in for recheck of his hyperlipidemia. The patient is currently taking pravastatin . They deny any issues with myalgias or history of liver damage from it. They deny any focal numbness or weakness or chest pain.   Relevant past medical, surgical, family and social history reviewed and updated as indicated. Interim medical history since our last visit reviewed. Allergies and medications reviewed and updated.  Review of Systems  Constitutional:  Negative for chills and fever.  Eyes:  Negative for visual disturbance.  Respiratory:  Negative for chest tightness and shortness of breath.   Cardiovascular:  Negative for chest pain and leg swelling.  Genitourinary:  Negative for difficulty urinating and dysuria.  Musculoskeletal:  Negative for back pain and gait problem.  Skin:  Negative for rash.  Neurological:  Negative for dizziness,  light-headedness and headaches.  Psychiatric/Behavioral:  Negative for agitation and behavioral problems.   All other systems reviewed and are negative.   Per HPI unless specifically indicated above   Allergies as of 05/21/2024       Reactions   Ibuprofen Rash   Naproxen Rash        Medication List        Accurate as of May 21, 2024  1:21 PM. If you have any questions, ask your nurse or doctor.          amLODipine  5 MG tablet Commonly known as: NORVASC  TAKE 1 TABLET(5 MG) BY MOUTH DAILY   cetirizine  10 MG tablet Commonly known as: ZYRTEC  Take 1 tablet (10 mg total) by mouth daily.   cyclobenzaprine  5 MG tablet Commonly known as: FLEXERIL  Take 1 tablet (5 mg total) by mouth 3 (three) times daily as needed for muscle spasms.   folic acid 1 MG tablet Commonly known as: FOLVITE Take 1 mg by mouth daily.   pantoprazole  40 MG tablet Commonly known as: PROTONIX  Take 1 tablet (40 mg total) by mouth daily.   pravastatin  20 MG tablet Commonly known as: PRAVACHOL  Take 1 tablet (20 mg total) by mouth daily.   valsartan  160 MG tablet Commonly known as: DIOVAN  Take 1 tablet (160 mg total) by mouth daily.         Objective:   BP 130/81   Pulse 78   Ht 4\' 9"  (1.448 m)   Wt 121 lb (54.9 kg)   SpO2 98%   BMI 26.18 kg/m   Wt Readings  from Last 3 Encounters:  05/21/24 121 lb (54.9 kg)  01/14/24 118 lb (53.5 kg)  11/05/23 118 lb 3.2 oz (53.6 kg)    Physical Exam Vitals and nursing note reviewed.  Constitutional:      General: She is not in acute distress.    Appearance: She is well-developed. She is not diaphoretic.  Eyes:     Conjunctiva/sclera: Conjunctivae normal.  Cardiovascular:     Rate and Rhythm: Normal rate and regular rhythm.     Heart sounds: Normal heart sounds. No murmur heard. Pulmonary:     Effort: Pulmonary effort is normal. No respiratory distress.     Breath sounds: Normal breath sounds. No wheezing.  Musculoskeletal:        General:  No swelling.  Skin:    General: Skin is warm and dry.     Findings: No rash.  Neurological:     Mental Status: She is alert and oriented to person, place, and time.     Coordination: Coordination normal.  Psychiatric:        Behavior: Behavior normal.       Assessment & Plan:   Problem List Items Addressed This Visit       Cardiovascular and Mediastinum   Hypertension associated with type 2 diabetes mellitus (HCC)   Relevant Medications   valsartan  (DIOVAN ) 160 MG tablet     Endocrine   Type 2 diabetes mellitus with diabetic neuropathy, without long-term current use of insulin (HCC) - Primary   Relevant Medications   valsartan  (DIOVAN ) 160 MG tablet   Other Relevant Orders   Bayer DCA Hb A1c Waived   CBC with Differential/Platelet   CMP14+EGFR   Lipid panel     Other   Hyperlipidemia LDL goal <130   Relevant Medications   valsartan  (DIOVAN ) 160 MG tablet   Other Relevant Orders   Bayer DCA Hb A1c Waived   CBC with Differential/Platelet   CMP14+EGFR   Lipid panel   Other Visit Diagnoses       Essential hypertension       Relevant Medications   valsartan  (DIOVAN ) 160 MG tablet   Other Relevant Orders   Bayer DCA Hb A1c Waived   CBC with Differential/Platelet   CMP14+EGFR   Lipid panel       Blood pressure and everything looks good, A1c and blood work is pending. Follow up plan: Return in about 3 months (around 08/21/2024), or if symptoms worsen or fail to improve, for Diabetes.  Counseling provided for all of the vaccine components Orders Placed This Encounter  Procedures   Bayer DCA Hb A1c Waived   CBC with Differential/Platelet   CMP14+EGFR   Lipid panel    Jolyne Needs, MD [facility] 05/21/2024, 1:21 PM

## 2024-05-22 LAB — CMP14+EGFR
ALT: 17 IU/L (ref 0–32)
AST: 17 IU/L (ref 0–40)
Albumin: 4.5 g/dL (ref 3.8–4.9)
Alkaline Phosphatase: 87 IU/L (ref 44–121)
BUN/Creatinine Ratio: 15 (ref 12–28)
BUN: 10 mg/dL (ref 8–27)
Bilirubin Total: 0.5 mg/dL (ref 0.0–1.2)
CO2: 21 mmol/L (ref 20–29)
Calcium: 9.4 mg/dL (ref 8.7–10.3)
Chloride: 104 mmol/L (ref 96–106)
Creatinine, Ser: 0.65 mg/dL (ref 0.57–1.00)
Globulin, Total: 2.5 g/dL (ref 1.5–4.5)
Glucose: 115 mg/dL — ABNORMAL HIGH (ref 70–99)
Potassium: 4.1 mmol/L (ref 3.5–5.2)
Sodium: 141 mmol/L (ref 134–144)
Total Protein: 7 g/dL (ref 6.0–8.5)
eGFR: 101 mL/min/{1.73_m2} (ref >59)

## 2024-05-22 LAB — CBC WITH DIFFERENTIAL/PLATELET
Basophils Absolute: 0 10*3/uL (ref 0.0–0.2)
Basos: 0 %
EOS (ABSOLUTE): 0.2 10*3/uL (ref 0.0–0.4)
Eos: 2 %
Hematocrit: 42.7 % (ref 34.0–46.6)
Hemoglobin: 13.9 g/dL (ref 11.1–15.9)
Immature Grans (Abs): 0 10*3/uL (ref 0.0–0.1)
Immature Granulocytes: 0 %
Lymphocytes Absolute: 1.9 10*3/uL (ref 0.7–3.1)
Lymphs: 27 %
MCH: 28.8 pg (ref 26.6–33.0)
MCHC: 32.6 g/dL (ref 31.5–35.7)
MCV: 88 fL (ref 79–97)
Monocytes Absolute: 0.4 10*3/uL (ref 0.1–0.9)
Monocytes: 6 %
Neutrophils Absolute: 4.6 10*3/uL (ref 1.4–7.0)
Neutrophils: 65 %
Platelets: 250 10*3/uL (ref 150–450)
RBC: 4.83 x10E6/uL (ref 3.77–5.28)
RDW: 13.2 % (ref 11.7–15.4)
WBC: 7.1 10*3/uL (ref 3.4–10.8)

## 2024-05-22 LAB — LIPID PANEL
Chol/HDL Ratio: 2.1 ratio (ref 0.0–4.4)
Cholesterol, Total: 191 mg/dL (ref 100–199)
HDL: 90 mg/dL (ref >39)
LDL Chol Calc (NIH): 90 mg/dL (ref 0–99)
Triglycerides: 56 mg/dL (ref 0–149)
VLDL Cholesterol Cal: 11 mg/dL (ref 5–40)

## 2024-05-28 ENCOUNTER — Ambulatory Visit: Payer: Self-pay | Admitting: Family Medicine

## 2024-06-16 ENCOUNTER — Encounter: Payer: Self-pay | Admitting: Family Medicine

## 2024-07-15 ENCOUNTER — Other Ambulatory Visit: Payer: Self-pay | Admitting: Family Medicine

## 2024-07-15 DIAGNOSIS — M62830 Muscle spasm of back: Secondary | ICD-10-CM

## 2024-07-15 DIAGNOSIS — M546 Pain in thoracic spine: Secondary | ICD-10-CM

## 2024-07-31 ENCOUNTER — Other Ambulatory Visit: Payer: Self-pay | Admitting: Family Medicine

## 2024-07-31 DIAGNOSIS — E785 Hyperlipidemia, unspecified: Secondary | ICD-10-CM

## 2024-07-31 DIAGNOSIS — I10 Essential (primary) hypertension: Secondary | ICD-10-CM

## 2024-08-25 ENCOUNTER — Ambulatory Visit: Admitting: Family Medicine

## 2024-08-25 ENCOUNTER — Encounter: Payer: Self-pay | Admitting: Family Medicine

## 2024-08-25 VITALS — BP 109/69 | HR 65 | Temp 97.8°F | Ht <= 58 in | Wt 121.4 lb

## 2024-08-25 DIAGNOSIS — M62830 Muscle spasm of back: Secondary | ICD-10-CM | POA: Diagnosis not present

## 2024-08-25 DIAGNOSIS — I10 Essential (primary) hypertension: Secondary | ICD-10-CM

## 2024-08-25 DIAGNOSIS — E785 Hyperlipidemia, unspecified: Secondary | ICD-10-CM

## 2024-08-25 DIAGNOSIS — E1140 Type 2 diabetes mellitus with diabetic neuropathy, unspecified: Secondary | ICD-10-CM

## 2024-08-25 DIAGNOSIS — M546 Pain in thoracic spine: Secondary | ICD-10-CM | POA: Diagnosis not present

## 2024-08-25 DIAGNOSIS — I152 Hypertension secondary to endocrine disorders: Secondary | ICD-10-CM

## 2024-08-25 DIAGNOSIS — E1159 Type 2 diabetes mellitus with other circulatory complications: Secondary | ICD-10-CM

## 2024-08-25 LAB — BAYER DCA HB A1C WAIVED: HB A1C (BAYER DCA - WAIVED): 6.7 % — ABNORMAL HIGH (ref 4.8–5.6)

## 2024-08-25 MED ORDER — AMLODIPINE BESYLATE 5 MG PO TABS: ORAL_TABLET | ORAL | 3 refills | Status: DC

## 2024-08-25 MED ORDER — PRAVASTATIN SODIUM 20 MG PO TABS
20.0000 mg | ORAL_TABLET | Freq: Every evening | ORAL | 3 refills | Status: DC
Start: 2024-08-25 — End: 2024-11-01

## 2024-08-25 MED ORDER — AMLODIPINE BESYLATE 5 MG PO TABS: ORAL_TABLET | ORAL | 0 refills | Status: DC

## 2024-08-25 MED ORDER — CYCLOBENZAPRINE HCL 5 MG PO TABS
5.0000 mg | ORAL_TABLET | Freq: Three times a day (TID) | ORAL | 3 refills | Status: AC | PRN
Start: 2024-08-25 — End: ?

## 2024-08-25 NOTE — Progress Notes (Signed)
 BP 109/69   Pulse 65   Temp 97.8 F (36.6 C)   Ht 4' 9 (1.448 m)   Wt 121 lb 6 oz (55.1 kg)   SpO2 95%   BMI 26.27 kg/m    Subjective:   Patient ID: Amy Mccarty, female    DOB: 1961/10/21, 63 y.o.   MRN: [id number]  HPI: Amy Mccarty is a 63 y.o. female presenting on 08/25/2024 for Medical Management of Chronic Issues (Patent is having right elbow pain all the way down to wrist, wanting xray. )   Discussed the use of AI scribe software for clinical note transcription with the patient, who gave verbal consent to proceed.  History of Present Illness   Amy Mccarty is a 63 year old female who presents for follow-up of diabetes and evaluation of chest discomfort.  She is not currently on medication for diabetes, and her recent hemoglobin A1c was 6.7, showing improvement from her last visit. She has no issues with her blood pressure, which is on the lower end, but she does not experience lightheadedness or dizziness. She is currently taking amlodipine  and valsartan  for blood pressure management, as well as folic acid.  She experiences recurring pain under her left breast, described as a pressure or ache rather than a sharp stabbing pain. This sensation occurs intermittently, often after sleeping in a certain position, and lasts for about an hour, coming and going. No associated trouble breathing is noted. Her sister inquired if it hurts when she moves a certain way, to which she responded 'nothing'.  She continues to take pantoprazole  daily for reflux and experiences gas and belching, particularly after eating spicy foods or eating late at night. She tries to avoid eating after 8 PM. She also takes pravastatin  for cholesterol and mentions experiencing aches, which she wonders might be related to the medication.  She reports pain in her right ankle and sometimes in her elbow, which she associates with repetitive movements, such as handling her dogs. She reports pain in her elbow,  which worsens with certain activities or positions. She uses Tylenol for pain relief and has tried to avoid using her right arm for certain tasks.  She avoids taking ibuprofen or Aleve due to concerns about stomach issues.      Relevant past medical, surgical, family and social history reviewed and updated as indicated. Interim medical history since our last visit reviewed. Allergies and medications reviewed and updated.  Review of Systems  Constitutional:  Negative for chills and fever.  HENT:  Negative for congestion, ear discharge and ear pain.   Eyes:  Negative for redness and visual disturbance.  Respiratory:  Negative for chest tightness and shortness of breath.   Cardiovascular:  Negative for chest pain and leg swelling.  Genitourinary:  Negative for difficulty urinating and dysuria.  Musculoskeletal:  Positive for arthralgias and myalgias. Negative for back pain and gait problem.  Skin:  Negative for rash.  Neurological:  Negative for light-headedness and headaches.  Psychiatric/Behavioral:  Negative for agitation and behavioral problems.   All other systems reviewed and are negative.   Per HPI unless specifically indicated above   Allergies as of 08/25/2024       Reactions   Ibuprofen Rash   Naproxen Rash        Medication List        Accurate as of August 25, 2024 11:42 AM. If you have any questions, ask your nurse or doctor.  amLODipine  5 MG tablet Commonly known as: NORVASC  TAKE 1 TABLET(5 MG) BY MOUTH DAILY   cetirizine  10 MG tablet Commonly known as: ZYRTEC  Take 1 tablet (10 mg total) by mouth daily.   cyclobenzaprine  5 MG tablet Commonly known as: FLEXERIL  Take 1 tablet (5 mg total) by mouth 3 (three) times daily as needed for muscle spasms. What changed: See the new instructions. Changed by: Fonda LABOR Leydi Winstead   folic acid 1 MG tablet Commonly known as: FOLVITE Take 1 mg by mouth daily.   pantoprazole  40 MG tablet Commonly known  as: PROTONIX  Take 1 tablet (40 mg total) by mouth daily.   pravastatin  20 MG tablet Commonly known as: PRAVACHOL  Take 1 tablet (20 mg total) by mouth at bedtime. What changed: See the new instructions. Changed by: Fonda LABOR Almeda Ezra   valsartan  160 MG tablet Commonly known as: DIOVAN  Take 1 tablet (160 mg total) by mouth daily.         Objective:   BP 109/69   Pulse 65   Temp 97.8 F (36.6 C)   Ht 4' 9 (1.448 m)   Wt 121 lb 6 oz (55.1 kg)   SpO2 95%   BMI 26.27 kg/m   Wt Readings from Last 3 Encounters:  08/25/24 121 lb 6 oz (55.1 kg)  05/21/24 121 lb (54.9 kg)  01/14/24 118 lb (53.5 kg)    Physical Exam Vitals and nursing note reviewed.  Constitutional:      General: She is not in acute distress.    Appearance: She is well-developed. She is not diaphoretic.  Eyes:     Conjunctiva/sclera: Conjunctivae normal.  Cardiovascular:     Rate and Rhythm: Normal rate and regular rhythm.     Heart sounds: Normal heart sounds. No murmur heard. Pulmonary:     Effort: Pulmonary effort is normal. No respiratory distress.     Breath sounds: Normal breath sounds. No wheezing.  Musculoskeletal:     Right elbow: Tenderness present in lateral epicondyle.  Skin:    General: Skin is warm and dry.     Findings: No rash.  Neurological:     Mental Status: She is alert and oriented to person, place, and time.     Coordination: Coordination normal.  Psychiatric:        Behavior: Behavior normal.    Physical Exam   CHEST: Lungs clear to auscultation bilaterally. CARDIOVASCULAR: Heart regular rate and rhythm, no murmurs. ABDOMEN: Tenderness in the upper abdomen. MUSCULOSKELETAL: Tenderness in the elbow, likely lateral epicondylitis.         Assessment & Plan:   Problem List Items Addressed This Visit       Cardiovascular and Mediastinum   Hypertension associated with type 2 diabetes mellitus (HCC)   Relevant Medications   pravastatin  (PRAVACHOL ) 20 MG tablet    amLODipine  (NORVASC ) 5 MG tablet     Endocrine   Type 2 diabetes mellitus with diabetic neuropathy, without long-term current use of insulin (HCC) - Primary   Relevant Medications   pravastatin  (PRAVACHOL ) 20 MG tablet   Other Relevant Orders   Bayer DCA Hb A1c Waived (Completed)     Other   Hyperlipidemia LDL goal <130   Relevant Medications   pravastatin  (PRAVACHOL ) 20 MG tablet   amLODipine  (NORVASC ) 5 MG tablet   Other Visit Diagnoses       Essential hypertension       Relevant Medications   pravastatin  (PRAVACHOL ) 20 MG tablet   amLODipine  (NORVASC ) 5 MG  tablet     Muscle spasm of back       Relevant Medications   cyclobenzaprine  (FLEXERIL ) 5 MG tablet     Acute right-sided thoracic back pain       Relevant Medications   cyclobenzaprine  (FLEXERIL ) 5 MG tablet          Gastroesophageal reflux disease without esophagitis Intermittent upper abdominal pain likely due to reflux, described as pressure-like, occurring mostly after sleeping in certain positions. - Double pantoprazole  to twice daily for 3-4 days. - Monitor symptoms and report if persistent. - Avoid eating after 8 PM.  Lateral epicondylitis, right elbow Pain in right elbow due to repetitive motion, consistent with lateral epicondylitis. - Apply Voltaren gel 4-5 times daily. - Avoid activities exacerbating pain, such as handling dogs or vacuuming. - Use left arm to allow right elbow to rest.  Type 2 diabetes mellitus without complications Hemoglobin A1c at 6.7%, indicating good control.  Essential (primary) hypertension Blood pressure well-managed with amlodipine  and valsartan .  Hyperlipidemia, unspecified Taking pravastatin  for cholesterol management, no specific issues reported.       Follow up plan: Return in about 3 months (around 11/25/2024), or if symptoms worsen or fail to improve, for Diabetes recheck.  Counseling provided for all of the vaccine components Orders Placed This Encounter   Procedures   Bayer DCA Hb A1c Waived    Fonda Levins, MD [facility] 08/25/2024, 11:42 AM

## 2024-10-05 ENCOUNTER — Other Ambulatory Visit: Payer: Self-pay | Admitting: Family Medicine

## 2024-10-05 DIAGNOSIS — K219 Gastro-esophageal reflux disease without esophagitis: Secondary | ICD-10-CM

## 2024-11-01 ENCOUNTER — Other Ambulatory Visit: Payer: Self-pay | Admitting: *Deleted

## 2024-11-01 DIAGNOSIS — E785 Hyperlipidemia, unspecified: Secondary | ICD-10-CM

## 2024-11-01 DIAGNOSIS — I10 Essential (primary) hypertension: Secondary | ICD-10-CM

## 2024-12-02 ENCOUNTER — Ambulatory Visit: Payer: Self-pay | Admitting: Family Medicine

## 2024-12-02 ENCOUNTER — Encounter: Payer: Self-pay | Admitting: Family Medicine

## 2024-12-02 VITALS — BP 136/74 | HR 67 | Ht <= 58 in | Wt 125.0 lb

## 2024-12-02 DIAGNOSIS — E1140 Type 2 diabetes mellitus with diabetic neuropathy, unspecified: Secondary | ICD-10-CM

## 2024-12-02 DIAGNOSIS — E1159 Type 2 diabetes mellitus with other circulatory complications: Secondary | ICD-10-CM | POA: Diagnosis not present

## 2024-12-02 DIAGNOSIS — K219 Gastro-esophageal reflux disease without esophagitis: Secondary | ICD-10-CM

## 2024-12-02 DIAGNOSIS — E785 Hyperlipidemia, unspecified: Secondary | ICD-10-CM | POA: Diagnosis not present

## 2024-12-02 DIAGNOSIS — I10 Essential (primary) hypertension: Secondary | ICD-10-CM | POA: Diagnosis not present

## 2024-12-02 LAB — CBC WITH DIFFERENTIAL/PLATELET
Basophils Absolute: 0 x10E3/uL (ref 0.0–0.2)
Basos: 1 %
EOS (ABSOLUTE): 0.2 x10E3/uL (ref 0.0–0.4)
Eos: 3 %
Hematocrit: 41.4 % (ref 34.0–46.6)
Hemoglobin: 13.5 g/dL (ref 11.1–15.9)
Immature Grans (Abs): 0 x10E3/uL (ref 0.0–0.1)
Immature Granulocytes: 0 %
Lymphocytes Absolute: 1.7 x10E3/uL (ref 0.7–3.1)
Lymphs: 30 %
MCH: 29.1 pg (ref 26.6–33.0)
MCHC: 32.6 g/dL (ref 31.5–35.7)
MCV: 89 fL (ref 79–97)
Monocytes Absolute: 0.4 x10E3/uL (ref 0.1–0.9)
Monocytes: 7 %
Neutrophils Absolute: 3.4 x10E3/uL (ref 1.4–7.0)
Neutrophils: 59 %
Platelets: 227 x10E3/uL (ref 150–450)
RBC: 4.64 x10E6/uL (ref 3.77–5.28)
RDW: 12.9 % (ref 11.7–15.4)
WBC: 5.7 x10E3/uL (ref 3.4–10.8)

## 2024-12-02 LAB — CMP14+EGFR
ALT: 18 IU/L (ref 0–32)
AST: 20 IU/L (ref 0–40)
Albumin: 4.6 g/dL (ref 3.9–4.9)
Alkaline Phosphatase: 90 IU/L (ref 49–135)
BUN/Creatinine Ratio: 17 (ref 12–28)
BUN: 12 mg/dL (ref 8–27)
Bilirubin Total: 0.4 mg/dL (ref 0.0–1.2)
CO2: 23 mmol/L (ref 20–29)
Calcium: 9.2 mg/dL (ref 8.7–10.3)
Chloride: 102 mmol/L (ref 96–106)
Creatinine, Ser: 0.72 mg/dL (ref 0.57–1.00)
Globulin, Total: 2.4 g/dL (ref 1.5–4.5)
Glucose: 153 mg/dL — ABNORMAL HIGH (ref 70–99)
Potassium: 4.6 mmol/L (ref 3.5–5.2)
Sodium: 140 mmol/L (ref 134–144)
Total Protein: 7 g/dL (ref 6.0–8.5)
eGFR: 95 mL/min/1.73 (ref >59)

## 2024-12-02 LAB — LIPID PANEL
Chol/HDL Ratio: 2.1 ratio (ref 0.0–4.4)
Cholesterol, Total: 194 mg/dL (ref 100–199)
HDL: 91 mg/dL (ref >39)
LDL Chol Calc (NIH): 90 mg/dL (ref 0–99)
Triglycerides: 70 mg/dL (ref 0–149)
VLDL Cholesterol Cal: 13 mg/dL (ref 5–40)

## 2024-12-02 LAB — BAYER DCA HB A1C WAIVED: HB A1C (BAYER DCA - WAIVED): 7.2 % — ABNORMAL HIGH (ref 4.8–5.6)

## 2024-12-02 LAB — TSH: TSH: 1.37 u[IU]/mL (ref 0.450–4.500)

## 2024-12-02 MED ORDER — PRAVASTATIN SODIUM 20 MG PO TABS: 20.0000 mg | ORAL_TABLET | Freq: Every day | ORAL | 3 refills | Status: DC

## 2024-12-02 MED ORDER — PANTOPRAZOLE SODIUM 40 MG PO TBEC: 40.0000 mg | DELAYED_RELEASE_TABLET | Freq: Two times a day (BID) | ORAL | 3 refills | Status: AC

## 2024-12-02 MED ORDER — AMLODIPINE BESYLATE 5 MG PO TABS: ORAL_TABLET | ORAL | 3 refills | Status: DC

## 2024-12-02 NOTE — Progress Notes (Signed)
 BP 136/74   Pulse 67   Ht 4' 9 (1.448 m)   Wt 125 lb (56.7 kg)   SpO2 98%   BMI 27.05 kg/m    Subjective:   Patient ID: Amy Mccarty, female    DOB: 09/29/1961, 63 y.o.   MRN: [id number]  HPI: Amy Mccarty is a 63 y.o. female presenting on 12/02/2024 for Medical Management of Chronic Issues and Diabetes   Discussed the use of AI scribe software for clinical note transcription with the patient, who gave verbal consent to proceed.  History of Present Illness   [name] is a 63 year old female with diabetes who presents for follow-up of her blood sugar levels.  Glycemic control - Blood glucose levels have been fluctuating, with recent afternoon readings of 142 mg/dL and morning readings of 138-139 mg/dL. - Lowest recent blood glucose reading was 124 mg/dL. - Hemoglobin A1c has increased from 6.7% to 7.2%. - Dietary changes around Thanksgiving attributed to increased blood sugar levels.  Gastroesophageal reflux symptoms - Continues heartburn medication twice daily, in the morning and afternoon. - Heartburn symptoms are effectively controlled with current regimen.  Renal function monitoring - No urinary symptoms reported. - Recent urine sample provided for kidney function monitoring.  Arthralgia - Arthritis pain is present and worsens during winter months.          Relevant past medical, surgical, family and social history reviewed and updated as indicated. Interim medical history since our last visit reviewed. Allergies and medications reviewed and updated.  Review of Systems  Constitutional:  Negative for chills and fever.  Eyes:  Negative for visual disturbance.  Respiratory:  Negative for chest tightness and shortness of breath.   Cardiovascular:  Negative for chest pain and leg swelling.  Genitourinary:  Negative for difficulty urinating and dysuria.  Musculoskeletal:  Negative for back pain and gait problem.  Skin:  Negative for rash.   Neurological:  Negative for dizziness, light-headedness and headaches.  Psychiatric/Behavioral:  Negative for agitation and behavioral problems.   All other systems reviewed and are negative.   Per HPI unless specifically indicated above   Allergies as of 12/02/2024       Reactions   Ibuprofen Rash   Naproxen Rash        Medication List        Accurate as of December 02, 2024 11:43 AM. If you have any questions, ask your nurse or doctor.          amLODipine  5 MG tablet Commonly known as: NORVASC  TAKE 1 TABLET(5 MG) BY MOUTH DAILY   cetirizine  10 MG tablet Commonly known as: ZYRTEC  Take 1 tablet (10 mg total) by mouth daily.   cyclobenzaprine  5 MG tablet Commonly known as: FLEXERIL  Take 1 tablet (5 mg total) by mouth 3 (three) times daily as needed for muscle spasms.   folic acid 1 MG tablet Commonly known as: FOLVITE Take 1 mg by mouth daily.   pantoprazole  40 MG tablet Commonly known as: PROTONIX  Take 1 tablet (40 mg total) by mouth 2 (two) times daily before a meal. What changed: See the new instructions. Changed by: Fonda LABOR Omarii Scalzo   pravastatin  20 MG tablet Commonly known as: PRAVACHOL  Take 1 tablet (20 mg total) by mouth daily. What changed: See the new instructions. Changed by: Fonda LABOR Aidenn Skellenger   valsartan  160 MG tablet Commonly known as: DIOVAN  Take 1 tablet (160 mg total) by mouth daily.         Objective:  BP 136/74   Pulse 67   Ht 4' 9 (1.448 m)   Wt 125 lb (56.7 kg)   SpO2 98%   BMI 27.05 kg/m   Wt Readings from Last 3 Encounters:  12/02/24 125 lb (56.7 kg)  08/25/24 121 lb 6 oz (55.1 kg)  05/21/24 121 lb (54.9 kg)    Physical Exam Vitals and nursing note reviewed.  Constitutional:      General: She is not in acute distress.    Appearance: She is well-developed. She is not diaphoretic.  Eyes:     Conjunctiva/sclera: Conjunctivae normal.  Cardiovascular:     Rate and Rhythm: Normal rate and regular rhythm.      Heart sounds: Normal heart sounds. No murmur heard. Pulmonary:     Effort: Pulmonary effort is normal. No respiratory distress.     Breath sounds: Normal breath sounds. No wheezing.  Musculoskeletal:        General: No swelling.  Skin:    General: Skin is warm and dry.     Findings: No rash.  Neurological:     Mental Status: She is alert and oriented to person, place, and time.     Coordination: Coordination normal.  Psychiatric:        Behavior: Behavior normal.    Physical Exam   VITALS: BP- 136/74 CHEST: Lungs clear to auscultation bilaterally. CARDIOVASCULAR: Heart regular rate and rhythm, no murmurs.         Assessment & Plan:   Problem List Items Addressed This Visit       Cardiovascular and Mediastinum   Hypertension associated with type 2 diabetes mellitus (HCC)   Relevant Medications   amLODipine  (NORVASC ) 5 MG tablet   pravastatin  (PRAVACHOL ) 20 MG tablet     Digestive   GERD (gastroesophageal reflux disease)   Relevant Medications   pantoprazole  (PROTONIX ) 40 MG tablet     Endocrine   Type 2 diabetes mellitus with diabetic neuropathy, without long-term current use of insulin (HCC) - Primary   Relevant Medications   pravastatin  (PRAVACHOL ) 20 MG tablet   Other Relevant Orders   CBC with Differential/Platelet   CMP14+EGFR   Lipid panel   TSH   Bayer DCA Hb A1c Waived   Microalbumin/Creatinine Ratio, Urine     Other   Hyperlipidemia LDL goal <130   Relevant Medications   amLODipine  (NORVASC ) 5 MG tablet   pravastatin  (PRAVACHOL ) 20 MG tablet   Other Relevant Orders   CBC with Differential/Platelet   CMP14+EGFR   Lipid panel   TSH   Bayer DCA Hb A1c Waived   Other Visit Diagnoses       Essential hypertension       Relevant Medications   amLODipine  (NORVASC ) 5 MG tablet   pravastatin  (PRAVACHOL ) 20 MG tablet   Other Relevant Orders   CBC with Differential/Platelet   CMP14+EGFR   Lipid panel   TSH   Bayer DCA Hb A1c Waived          Type 2 diabetes mellitus Blood glucose levels fluctuate with recent readings of 142 mg/dL in the afternoon and 861-860 mg/dL in the morning. Hemoglobin A1c increased from 6.7% to 7.2%, likely due to dietary changes around Thanksgiving. No medication changes necessary. - Encouraged dietary modifications to reduce sweets intake. - Advised increasing physical activity, especially before the upcoming holidays.  Essential hypertension Blood pressure is 136/74 mmHg, with systolic close to target and diastolic within desired range. - Continue current antihypertensive regimen. - Encouraged lifestyle modifications  including diet and exercise.  Gastroesophageal reflux disease Symptoms well-controlled with current medication regimen. - Continue current heartburn medication regimen.  Osteoarthritis Chronic pain persists, exacerbated by winter weather. - Encouraged physical activity as tolerated.  General Health Maintenance Due for Pap smear and mammogram. Last Pap smear in May 2024 was normal, but previous was abnormal, necessitating more frequent screening. Mammogram due after May 2024. - Contacted [facility] for Pap smear scheduling. - Scheduled mammogram after May 2024.          Follow up plan: Return in about 3 months (around 03/02/2025), or if symptoms worsen or fail to improve, for Diabetes.  Counseling provided for all of the vaccine components Orders Placed This Encounter  Procedures   CBC with Differential/Platelet   CMP14+EGFR   Lipid panel   TSH   Bayer DCA Hb A1c Waived   Microalbumin/Creatinine Ratio, Urine    Fonda Levins, MD [facility] 12/02/2024, 11:43 AM

## 2024-12-03 ENCOUNTER — Telehealth: Payer: Self-pay | Admitting: Pharmacy Technician

## 2024-12-03 ENCOUNTER — Other Ambulatory Visit (HOSPITAL_COMMUNITY): Payer: Self-pay

## 2024-12-03 NOTE — Telephone Encounter (Signed)
 Pharmacy Patient Advocate Encounter  Received notification from [facility] that Prior Authorization for Pantoprazole  Sodium 40MG  dr tablet has been APPROVED from 12/03/24 to 12/03/25. Ran test claim, Copay is $12.15 for 3 months. This test claim was processed through [facility]- copay amounts may vary at other pharmacies due to pharmacy/plan contracts, or as the patient moves through the different stages of their insurance plan.   PA #/Case ID/Reference #: [id number]  **quantity limit PA approved for twice a day dosing**

## 2024-12-03 NOTE — Telephone Encounter (Signed)
 Pharmacy Patient Advocate Encounter   Received notification from Onbase that prior authorization for Pantoprazole  Sodium 40MG  dr tablets is required/requested.   Insurance verification completed.   The patient is insured through [facility]   Per test claim: PA required; PA started via CoverMyMeds. KEY [id number] . Waiting for clinical questions to populate.

## 2024-12-06 ENCOUNTER — Ambulatory Visit: Payer: Self-pay | Admitting: Family Medicine

## 2025-01-31 ENCOUNTER — Other Ambulatory Visit: Payer: Self-pay | Admitting: Family Medicine

## 2025-01-31 DIAGNOSIS — I10 Essential (primary) hypertension: Secondary | ICD-10-CM

## 2025-01-31 DIAGNOSIS — E785 Hyperlipidemia, unspecified: Secondary | ICD-10-CM

## 2025-03-03 ENCOUNTER — Ambulatory Visit: Admitting: Family Medicine
